# Patient Record
Sex: Female | Born: 1990 | Race: Black or African American | Hispanic: No | Marital: Single | Smoking: Current some day smoker
Health system: Southern US, Community
[De-identification: ages and names within clinical notes are randomized; demographics above are authoritative.]

## PROBLEM LIST (undated history)

## (undated) DIAGNOSIS — E559 Vitamin D deficiency, unspecified: Secondary | ICD-10-CM

## (undated) DIAGNOSIS — N879 Dysplasia of cervix uteri, unspecified: Secondary | ICD-10-CM

## (undated) DIAGNOSIS — E669 Obesity, unspecified: Secondary | ICD-10-CM

## (undated) DIAGNOSIS — M2550 Pain in unspecified joint: Secondary | ICD-10-CM

## (undated) DIAGNOSIS — R87610 Atypical squamous cells of undetermined significance on cytologic smear of cervix (ASC-US): Secondary | ICD-10-CM

## (undated) DIAGNOSIS — N939 Abnormal uterine and vaginal bleeding, unspecified: Secondary | ICD-10-CM

## (undated) HISTORY — PX: PELVIC FRACTURE SURGERY: SHX119

## (undated) HISTORY — DX: Vitamin D deficiency, unspecified: E55.9

## (undated) HISTORY — DX: Obesity, unspecified: E66.9

## (undated) HISTORY — DX: Dysplasia of cervix uteri, unspecified: N87.9

## (undated) HISTORY — PX: LEG SURGERY: SHX1003

## (undated) HISTORY — DX: Atypical squamous cells of undetermined significance on cytologic smear of cervix (ASC-US): R87.610

## (undated) HISTORY — DX: Pain in unspecified joint: M25.50

## (undated) HISTORY — DX: Abnormal uterine and vaginal bleeding, unspecified: N93.9

---
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
## 2009-01-04 ENCOUNTER — Other Ambulatory Visit: Admission: RE | Admit: 2009-01-04 | Discharge: 2009-01-04 | Payer: Self-pay | Admitting: Family Medicine

## 2010-01-14 ENCOUNTER — Emergency Department (HOSPITAL_COMMUNITY): Admission: EM | Admit: 2010-01-14 | Discharge: 2010-01-14 | Payer: Self-pay | Admitting: Emergency Medicine

## 2010-04-10 ENCOUNTER — Other Ambulatory Visit
Admission: RE | Admit: 2010-04-10 | Discharge: 2010-04-10 | Payer: Self-pay | Source: Home / Self Care | Admitting: Family Medicine

## 2010-04-29 HISTORY — PX: FEMUR FRACTURE SURGERY: SHX633

## 2010-07-12 LAB — POCT PREGNANCY, URINE: Preg Test, Ur: NEGATIVE

## 2015-10-15 ENCOUNTER — Emergency Department (HOSPITAL_COMMUNITY): Payer: Self-pay

## 2015-10-15 ENCOUNTER — Emergency Department (HOSPITAL_COMMUNITY)
Admission: EM | Admit: 2015-10-15 | Discharge: 2015-10-15 | Disposition: A | Discharge status: Home / Self Care | Payer: Self-pay | Attending: Emergency Medicine | Admitting: Emergency Medicine

## 2015-10-15 ENCOUNTER — Encounter (HOSPITAL_COMMUNITY): Payer: Self-pay

## 2015-10-15 DIAGNOSIS — Y929 Unspecified place or not applicable: Secondary | ICD-10-CM | POA: Insufficient documentation

## 2015-10-15 DIAGNOSIS — S6721XA Crushing injury of right hand, initial encounter: Secondary | ICD-10-CM | POA: Insufficient documentation

## 2015-10-15 DIAGNOSIS — W231XXA Caught, crushed, jammed, or pinched between stationary objects, initial encounter: Secondary | ICD-10-CM | POA: Insufficient documentation

## 2015-10-15 DIAGNOSIS — F17200 Nicotine dependence, unspecified, uncomplicated: Secondary | ICD-10-CM | POA: Insufficient documentation

## 2015-10-15 DIAGNOSIS — Y999 Unspecified external cause status: Secondary | ICD-10-CM | POA: Insufficient documentation

## 2015-10-15 DIAGNOSIS — Y9389 Activity, other specified: Secondary | ICD-10-CM | POA: Insufficient documentation

## 2015-10-15 MED ORDER — IBUPROFEN 800 MG PO TABS: 800.0000 mg | ORAL_TABLET | Freq: Three times a day (TID) | ORAL | Status: DC

## 2015-10-15 MED ORDER — IBUPROFEN 800 MG PO TABS
800.0000 mg | ORAL_TABLET | Freq: Once | ORAL | Status: AC
  Administered 2015-10-15: 800 mg via ORAL
  Filled 2015-10-15: qty 1

## 2015-10-15 NOTE — Discharge Instructions (Signed)
RICE for Routine Care of Injuries Theroutine careofmanyinjuriesincludes rest, ice, compression, and elevation (RICE therapy). RICE therapy is often recommended for injuries to soft tissues, such as a muscle strain, ligament injuries, bruises, and overuse injuries. It can also be used for some bony injuries. Using RICE therapy can help to relieve pain, lessen swelling, and enable your body to heal. Rest Rest is required to allow your body to heal. This usually involves reducing your normal activities and avoiding use of the injured part of your body. Generally, you can return to your normal activities when you are comfortable and have been given permission by your health care provider. Ice Icing your injury helps to keep the swelling down, and it lessens pain. Do not apply ice directly to your skin.  Put ice in a plastic bag.  Place a towel between your skin and the bag.  Leave the ice on for 20 minutes, 2-3 times a day. Do this for as long as you are directed by your health care provider. Compression Compression means putting pressure on the injured area. Compression helps to keep swelling down, gives support, and helps with discomfort. Compression may be done with an elastic bandage. If an elastic bandage has been applied, follow these general tips:  Remove and reapply the bandage every 3-4 hours or as directed by your health care provider.  Make sure the bandage is not wrapped too tightly, because this can cut off circulation. If part of your body beyond the bandage becomes blue, numb, cold, swollen, or more painful, your bandage is most likely too tight. If this occurs, remove your bandage and reapply it more loosely.  See your health care provider if the bandage seems to be making your problems worse rather than better. Elevation Elevation means keeping the injured area raised. This helps to lessen swelling and decrease pain. If possible, your injured area should be elevated at or  above the level of your heart or the center of your chest. WHEN SHOULD I SEEK MEDICAL CARE? You should seek medical care if:  Your pain and swelling continue.  Your symptoms are getting worse rather than improving. These symptoms may indicate that further evaluation or further X-rays are needed. Sometimes, X-rays may not show a small broken bone (fracture) until a number of days later. Make a follow-up appointment with your health care provider. WHEN SHOULD I SEEK IMMEDIATE MEDICAL CARE? You should seek immediate medical care if:  You have sudden severe pain at or below the area of your injury.  You have redness or increased swelling around your injury.  You have tingling or numbness at or below the area of your injury that does not improve after you remove the elastic bandage.   This information is not intended to replace advice given to you by your health care provider. Make sure you discuss any questions you have with your health care provider.   Document Released: 07/28/2000 Document Revised: 01/04/2015 Document Reviewed: 03/23/2014 Elsevier Interactive Patient Education 2016 Else[employer] Crush Injury, Fingers or Toes A crush injury to the fingers or toes means the tissues have been damaged by being squeezed (compressed). There will be bleeding into the tissues and swelling. Often, blood will collect under the skin. When this happens, the skin on the finger often dies and may slough off (shed) 1 week to 10 days later. Usually, new skin is growing underneath. If the injury has been too severe and the tissue does not survive, the damaged tissue may begin to  turn black over several days.  Wounds which occur because of the crushing may be stitched (sutured) shut. However, crush injuries are more likely to become infected than other injuries.These wounds may not be closed as tightly as other types of cuts to prevent infection. Nails involved are often lost. These usually grow back over  several weeks.  DIAGNOSIS X-rays may be taken to see if there is any injury to the bones. TREATMENT Broken bones (fractures) may be treated with splinting, depending on the fracture. Often, no treatment is required for fractures of the last bone in the fingers or toes. HOME CARE INSTRUCTIONS   The crushed part should be raised (elevated) above the heart or center of the chest as much as possible for the first several days or as directed. This helps with pain and lessens swelling. Less swelling increases the chances that the crushed part will survive.  Put ice on the injured area.  Put ice in a plastic bag.  Place a towel between your skin and the bag.  Leave the ice on for 15-20 minutes, 03-04 times a day for the first 2 days.  Only take over-the-counter or prescription medicines for pain, discomfort, or fever as directed by your caregiver.  Use your injured part only as directed.  Change your bandages (dressings) as directed.  Keep all follow-up appointments as directed by your caregiver. Not keeping your appointment could result in a chronic or permanent injury, pain, and disability. If there is any problem keeping the appointment, you must call to reschedule. SEEK IMMEDIATE MEDICAL CARE IF:   There is redness, swelling, or increasing pain in the wound area.  Pus is coming from the wound.  You have a fever.  You notice a bad smell coming from the wound or dressing.  The edges of the wound do not stay together after the sutures have been removed.  You are unable to move the injured finger or toe. MAKE SURE YOU:   Understand these instructions.  Will watch your condition.  Will get help right away if you are not doing well or get worse.   This information is not intended to replace advice given to you by your health care provider. Make sure you discuss any questions you have with your health care provider.   Document Released: 04/15/2005 Document Revised: 07/08/2011  Document Reviewed: 08/31/2010 Elsevier Interactive Patient Education [employer] Elsevier Inc[employer]

## 2015-10-15 NOTE — ED Provider Notes (Signed)
CSN: [id number]     Arrival date & time 10/15/15  1259 History  By signing my name below, I, Linna DarnerRussell Turner, attest that this documentation has been prepared under the direction and in the presence of Javon Hupfer, PA-C. Electronically Signed: Linna Darnerussell Turner, Scribe. 10/15/2015. 2:42 PM.   Chief Complaint  Patient presents with  . Hand Injury   The history is provided by the patient. No language interpreter was used.    HPI Comments: Stacey Kerr is a 25 y.o. female who presents to the Emergency Department complaining of sudden onset, constant, dorsal right hand pain and swelling beginning a couple of hours ago s/p crush injury. Pt states that she was changing a tire with a car jack and her right hand became caught between the tire and the wheel well. She immediately removed her hand and denies the hand being entrapped. She states that her right hand swelled significantly immediately after the incident. The pain is over the dorsal surface of the hand and is throbbing. The pain does not radiate into the fingers or wrist. Pt reports that she is able to move her right fingers and wrist but cannot make a fist with her right hand. She has not taken any medications PTA. She denies numbness, abrasions, lacerations, or any other associated symptoms.  History reviewed. No pertinent past medical history. Past Surgical History  Procedure Laterality Date  . Leg surgery    . Pelvic fracture surgery     No family history on file. Social History  Substance Use Topics  . Smoking status: Current Some Day Smoker  . Smokeless tobacco: None  . Alcohol Use: Yes     Comment: socially    OB History    No data available     Review of Systems  Musculoskeletal: Positive for joint swelling (right hand) and arthralgias (right hand).  Neurological: Negative for numbness.  All other systems reviewed and are negative.  Allergies  Review of patient's allergies indicates no known allergies.  Home  Medications   Prior to Admission medications   Medication Sig Start Date End Date Taking? Authorizing Provider  ibuprofen (ADVIL,MOTRIN) 800 MG tablet Take 1 tablet (800 mg total) by mouth 3 (three) times daily. 10/15/15   Cordale Manera, PA-C   BP 129/87 mmHg  Pulse 74  Temp(Src) 98.2 F (36.8 C) (Oral)  Resp 19  SpO2 98%  LMP 09/30/2015 Physical Exam  Constitutional: She appears well-developed and well-nourished. No distress.  HENT:  Head: Normocephalic and atraumatic.  Right Ear: External ear normal.  Left Ear: External ear normal.  Eyes: Conjunctivae are normal. Right eye exhibits no discharge. Left eye exhibits no discharge. No scleral icterus.  Neck: Normal range of motion.  Cardiovascular: Normal rate and intact distal pulses.   Cap refill < 2 seconds  Pulmonary/Chest: Effort normal.  Musculoskeletal:       Right hand: She exhibits decreased range of motion (fist), tenderness and swelling. She exhibits normal capillary refill, no deformity and no laceration. Normal sensation noted. Normal strength noted.  Moderate swelling noted to dorsal surface of right hand. No swelling of wrist or digits. Tenderness over dorsal surface of right hand. No tenderness of wrist, palmar surface or digits. Hand compartment is soft. FROM of digits and wrist. Pt unable to make tight fist.   Neurological: She is alert. Coordination normal.  5/5 strength at right wrist and finger abduction and adduction. Pt unable to make tight fist secondary to soft tissue swelling. Sensation to light touch  intact over hands.   Skin: Skin is warm and dry.  Psychiatric: She has a normal mood and affect. Her behavior is normal.  Nursing note and vitals reviewed.   ED Course  Procedures (including critical care time)  DIAGNOSTIC STUDIES: Oxygen Saturation is 98% on RA, normal by my interpretation.    COORDINATION OF CARE: 2:42 PM Discussed treatment plan with pt at bedside and pt agreed to plan.  Labs  Review Labs Reviewed - No data to display  Imaging Review Dg Hand Complete Right  10/15/2015  CLINICAL DATA:  Right hand pain and swelling after injury with car [place] today. Initial encounter. EXAM: RIGHT HAND - COMPLETE 3+ VIEW COMPARISON:  None. FINDINGS: There is no evidence of fracture or dislocation. There is no evidence of arthropathy or other focal bone abnormality. Soft tissues are unremarkable. IMPRESSION: Normal right hand. Electronically Signed   By: Lupita Raider, M.D.   On: 10/15/2015 13:51   I have personally reviewed and evaluated these images and lab results as part of my medical decision-making.   EKG Interpretation None      MDM   Final diagnoses:  Hand crush injury, right, initial encounter   Patient presenting with right hand pain after crush injury. Right hand is neurovascularly intact with FROM. Swelling and pain noted to dorsal surface. Compartments are soft. Patient X-Ray negative for obvious fracture or dislocation. Pain managed in ED with ibuprofen. Discussed RICE therapy and use of OTC pain relievers. Pt advised to follow up with PCP if pain and swelling persist. Return precautions discussed at bedside and given in discharge paperwork. Pt is stable for discharge.  I personally performed the services described in this documentation, which was scribed in my presence. The recorded information has been reviewed and is accurate.   Alveta Heimlich, PA-C 10/15/15 1505  Benjiman Core, MD 10/15/15 [phone]

## 2015-10-15 NOTE — ED Notes (Signed)
Pt presents with c/o right hand injury. Pt was helping someone change a tire and got her hand caught between the tire and the car while using a car jack. Pt has significant swelling to that right hand. Pt is able to move her fingers but is unable to make a fist. Pt reports pain in her wrist up to her elbow.

## 2018-02-01 IMAGING — CR DG HAND COMPLETE 3+V*R*
3 series · 3 of 3 positions shown · non-contrast
Comparison: None.

CLINICAL DATA: Right hand pain and swelling after injury with car
Jack today. Initial encounter.

EXAM:
RIGHT HAND - COMPLETE 3+ VIEW

[x hand pa right]
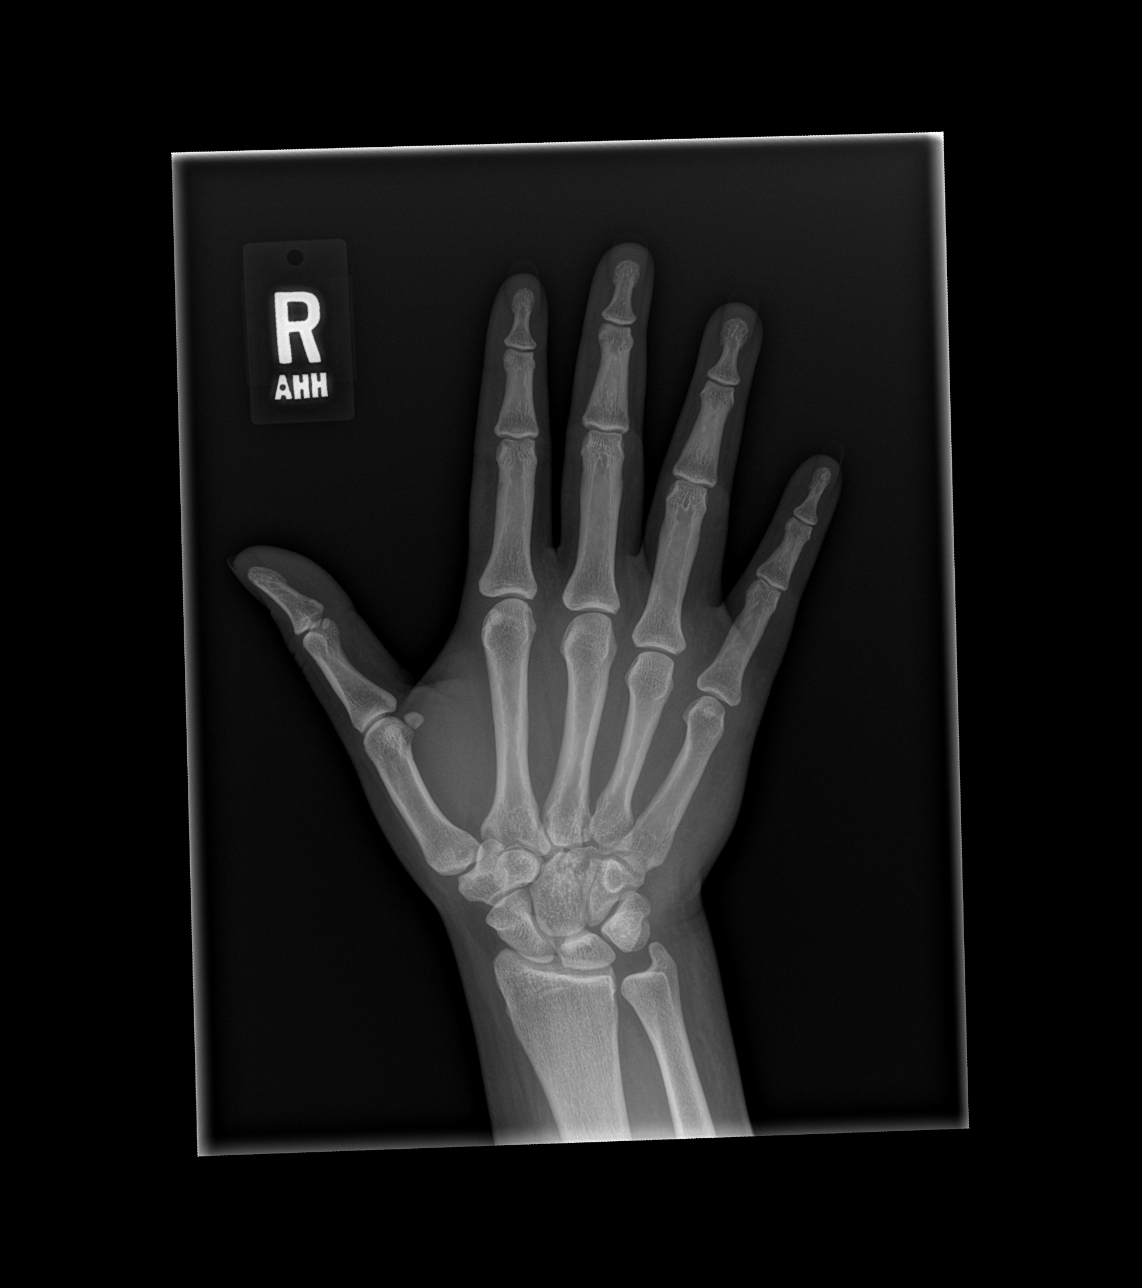

[x hand obl right]
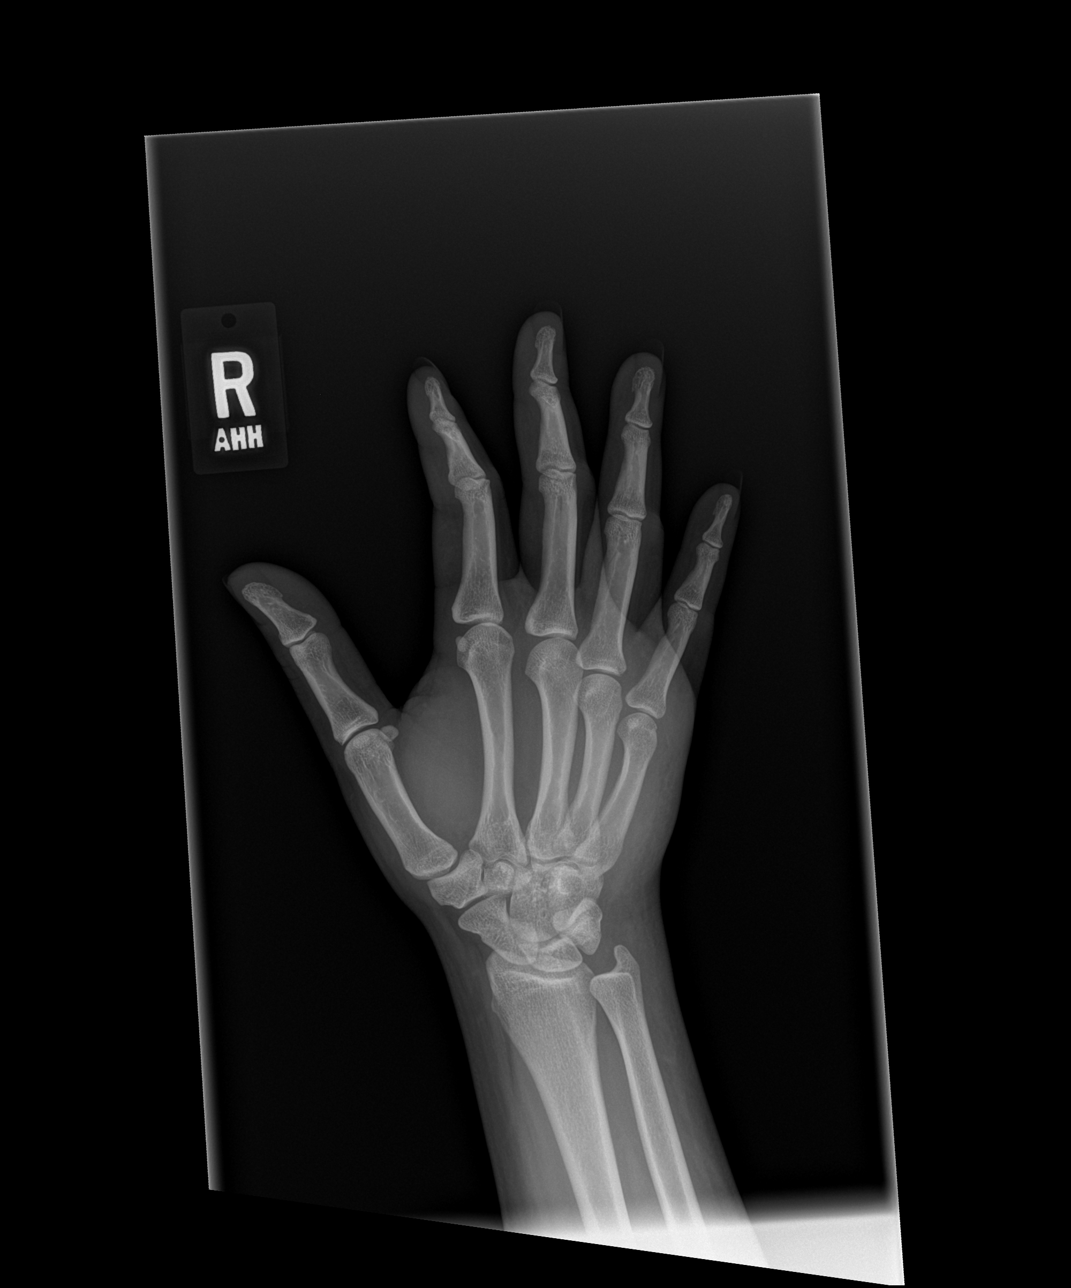

[x hand lat right]
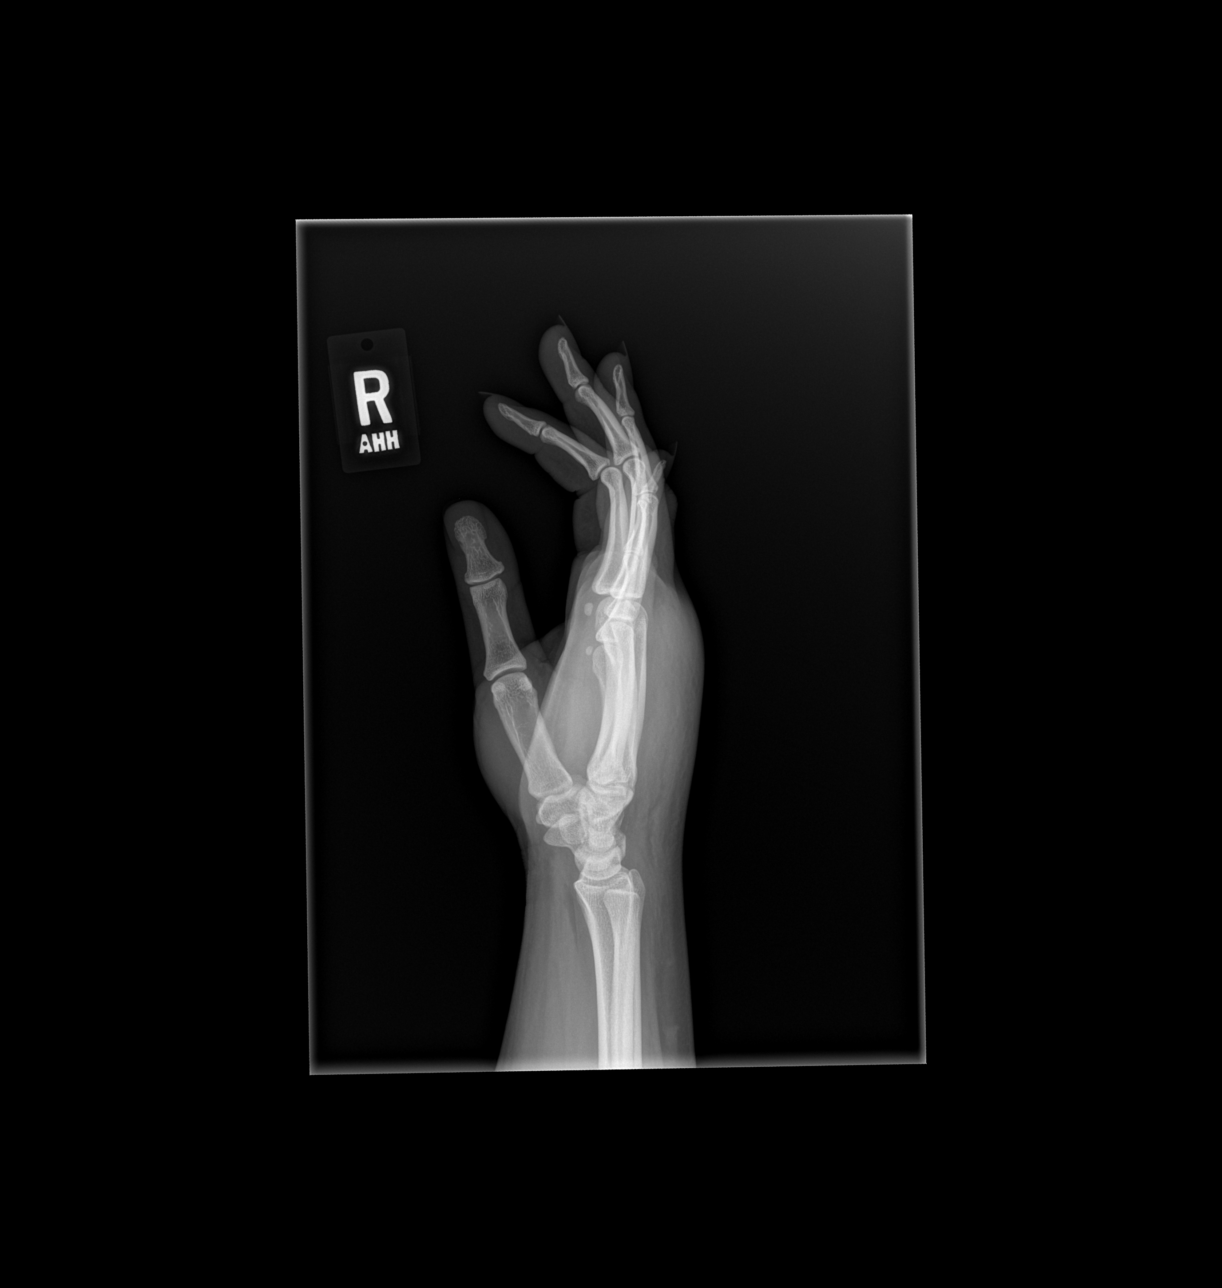

[3 of 3 positions shown; findings below may reference images not displayed]

FINDINGS: There is no evidence of fracture or dislocation. There is no
evidence of arthropathy or other focal bone abnormality. Soft
tissues are unremarkable.
IMPRESSION: Normal right hand.

## 2022-08-14 ENCOUNTER — Ambulatory Visit: Payer: Medicaid Other | Admitting: Nurse Practitioner

## 2022-08-14 ENCOUNTER — Encounter: Payer: Self-pay | Admitting: Nurse Practitioner

## 2022-08-14 VITALS — BP 103/55 | HR 76 | Temp 97.4°F | Ht 60.0 in | Wt 197.6 lb

## 2022-08-14 DIAGNOSIS — E669 Obesity, unspecified: Secondary | ICD-10-CM | POA: Diagnosis not present

## 2022-08-14 DIAGNOSIS — Z0000 Encounter for general adult medical examination without abnormal findings: Secondary | ICD-10-CM | POA: Diagnosis not present

## 2022-08-14 DIAGNOSIS — H6121 Impacted cerumen, right ear: Secondary | ICD-10-CM

## 2022-08-14 DIAGNOSIS — Z1321 Encounter for screening for nutritional disorder: Secondary | ICD-10-CM

## 2022-08-14 DIAGNOSIS — Z13228 Encounter for screening for other metabolic disorders: Secondary | ICD-10-CM | POA: Diagnosis not present

## 2022-08-14 DIAGNOSIS — Z1329 Encounter for screening for other suspected endocrine disorder: Secondary | ICD-10-CM

## 2022-08-14 DIAGNOSIS — Z130 Encounter for screening for diseases of the blood and blood-forming organs and certain disorders involving the immune mechanism: Secondary | ICD-10-CM | POA: Diagnosis not present

## 2022-08-14 DIAGNOSIS — Z720 Tobacco use: Secondary | ICD-10-CM

## 2022-08-14 DIAGNOSIS — N879 Dysplasia of cervix uteri, unspecified: Secondary | ICD-10-CM | POA: Insufficient documentation

## 2022-08-14 DIAGNOSIS — R87810 Cervical high risk human papillomavirus (HPV) DNA test positive: Secondary | ICD-10-CM

## 2022-08-14 DIAGNOSIS — R87610 Atypical squamous cells of undetermined significance on cytologic smear of cervix (ASC-US): Secondary | ICD-10-CM

## 2022-08-14 DIAGNOSIS — Z6838 Body mass index (BMI) 38.0-38.9, adult: Secondary | ICD-10-CM

## 2022-08-14 HISTORY — DX: Tobacco use: Z72.0

## 2022-08-14 HISTORY — DX: Obesity, unspecified: E66.9

## 2022-08-14 MED ORDER — DEBROX 6.5 % OT SOLN: 5.0000 [drp] | Freq: Two times a day (BID) | OTIC | 0 refills | Status: DC

## 2022-08-14 NOTE — Assessment & Plan Note (Signed)
Need to avoid smoking cigarettes due to risk of lung cancer, COPD discussed.

## 2022-08-14 NOTE — Assessment & Plan Note (Addendum)
Annual exam as documented.  Counseling done include healthy lifestyle involving committing to 150 minutes of exercise per week, heart healthy diet, and attaining healthy weight. The importance of adequate sleep also discussed.  Regular use of seat belt and home safety were also discussed . Changes in health habits are decided on by patient with goals and time frames set for achieving them. Immunization and cancer screening  needs are specifically addressed at this visit.    Fasting routine labs ordered today Up-to-date with Tdap vaccine Follow-up in 3 months for Pap smear Monthly SBE encouraged

## 2022-08-14 NOTE — Assessment & Plan Note (Signed)
Had a colposcopy last year Will be due for repeat Pap smear in June

## 2022-08-14 NOTE — Patient Instructions (Signed)
.   Screening for endocrine, nutritional, metabolic and immunity disorder  - CBC with Differential - CMP14+EGFR - TSH - Vitamin D, 25-hydroxy - Hemoglobin A1c - Lipid Panel  Annual physical exam    . Class 2 obesity without serious comorbidity with body mass index (BMI) of 38.0 to 38.9 in adult, unspecified obesity type   . Impacted cerumen of right ear  - carbamide peroxide (DEBROX) 6.5 % OTIC solution; Place 5 drops into the right ear 2 (two) times daily.  Dispense: 15 mL; Refill: 0    It is important that you exercise regularly at least 30 minutes 5 times a week as tolerated  Think about what you will eat, plan ahead. Choose " clean, green, fresh or frozen" over canned, processed or packaged foods which are more sugary, salty and fatty. 70 to 75% of food eaten should be vegetables and fruit. Three meals at set times with snacks allowed between meals, but they must be fruit or vegetables. Aim to eat over a 12 hour period , example 7 am to 7 pm, and STOP after  your last meal of the day. Drink water,generally about 64 ounces per day, no other drink is as healthy. Fruit juice is best enjoyed in a healthy way, by EATING the fruit.  Thanks for choosing [facility] we consider it a privelige to serve you.

## 2022-08-14 NOTE — Progress Notes (Signed)
Complete physical exam  Patient: Stacey Kerr   DOB: [birth year]   32 y.o. Female  MRN: [id number]  Subjective:    Chief Complaint  Patient presents with   Establish Care    Stacey Kerr is a 32 y.o. female  has a past medical history of Abnormal uterine bleeding, ASCUS with positive high risk HPV cervical, and Cervical dysplasia. who presents today for a complete physical exam. She reports consuming a general diet. Plans on starting excercises soon. The patient does not participate in regular exercise at present. She generally feels well. She reports sleeping well. She does not have additional problems to discuss today.   Previous PCP in [place], not able to remember their name.    ASCUS with positive high risk HPV.  Had colposcopy done last year with a plan to repeat Pap smear a year after.  She really denies any abnormal bleeding, vaginitis, has Nexplanon implant in place      Most recent fall risk assessment:    08/14/2022    8:21 AM  Fall Risk   Falls in the past year? 0  Number falls in past yr: 0  Injury with Fall? 0  Risk for fall due to : No Fall Risks  Follow up Falls evaluation completed     Most recent depression screenings:    08/14/2022    8:21 AM  PHQ 2/9 Scores  PHQ - 2 Score 0  PHQ- 9 Score 2        Patient Care Team: Donell Beers, FNP as PCP - General (Nurse Practitioner)   Outpatient Medications Prior to Visit  Medication Sig   ibuprofen (ADVIL,MOTRIN) 800 MG tablet Take 1 tablet (800 mg total) by mouth 3 (three) times daily. (Patient not taking: Reported on 08/14/2022)   No facility-administered medications prior to visit.    Review of Systems  Constitutional: Negative.   HENT: Negative.    Eyes: Negative.   Respiratory: Negative.    Cardiovascular: Negative.   Gastrointestinal: Negative.   Genitourinary: Negative.   Musculoskeletal: Negative.   Skin: Negative.   Neurological: Negative.   Endo/Heme/Allergies:  Negative.   Psychiatric/Behavioral: Negative.            Objective:     BP (!) 103/55   Pulse 76   Temp (!) 97.4 F (36.3 C)   Ht 5' (1.524 m)   Wt 197 lb 9.6 oz (89.6 kg)   LMP 07/26/2022 (Approximate)   SpO2 100%   BMI 38.59 kg/m    Physical Exam Constitutional:      General: She is not in acute distress.    Appearance: Normal appearance. She is obese. She is not ill-appearing, toxic-appearing or diaphoretic.  HENT:     Right Ear: Ear canal and external ear normal. There is impacted cerumen.     Left Ear: Tympanic membrane, ear canal and external ear normal. There is no impacted cerumen.     Mouth/Throat:     Mouth: Mucous membranes are moist.     Pharynx: Oropharynx is clear. No oropharyngeal exudate or posterior oropharyngeal erythema.  Eyes:     General: No scleral icterus.       Right eye: No discharge.        Left eye: No discharge.     Extraocular Movements: Extraocular movements intact.     Conjunctiva/sclera: Conjunctivae normal.  Neck:     Vascular: No carotid bruit.  Cardiovascular:     Rate and Rhythm: Normal rate and regular  rhythm.     Pulses: Normal pulses.     Heart sounds: Normal heart sounds. No murmur heard.    No friction rub. No gallop.  Pulmonary:     Effort: Pulmonary effort is normal. No respiratory distress.     Breath sounds: Normal breath sounds. No stridor. No wheezing, rhonchi or rales.  Chest:     Chest wall: No tenderness.  Abdominal:     General: There is no distension.     Palpations: Abdomen is soft. There is no mass.     Tenderness: There is no abdominal tenderness. There is no right CVA tenderness, left CVA tenderness, guarding or rebound.     Hernia: No hernia is present.  Musculoskeletal:        General: No swelling, tenderness, deformity or signs of injury.     Cervical back: Normal range of motion and neck supple. No rigidity or tenderness.     Right lower leg: No edema.     Left lower leg: No edema.   Lymphadenopathy:     Cervical: No cervical adenopathy.  Skin:    General: Skin is warm and dry.     Capillary Refill: Capillary refill takes less than 2 seconds.     Coloration: Skin is not jaundiced or pale.     Findings: No bruising, erythema, lesion or rash.  Neurological:     Mental Status: She is alert and oriented to person, place, and time.     Cranial Nerves: No cranial nerve deficit.     Sensory: No sensory deficit.     Motor: No weakness.     Coordination: Coordination normal.     Gait: Gait normal.     Deep Tendon Reflexes: Reflexes normal.  Psychiatric:        Mood and Affect: Mood normal.        Behavior: Behavior normal.        Thought Content: Thought content normal.        Judgment: Judgment normal.      No results found for any visits on 08/14/22.     Assessment & Plan:    Routine Health Maintenance and Physical Exam   There is no immunization history on file for this patient.  Health Maintenance  Topic Date Due   COVID-19 Vaccine (1) Never done   DTaP/Tdap/Td (1 - Tdap) Never done   INFLUENZA VACCINE  11/28/2022   PAP SMEAR-Modifier  08/14/2024   Hepatitis C Screening  Completed   HIV Screening  Completed   HPV VACCINES  Aged Out    Discussed health benefits of physical activity, and encouraged her to engage in regular exercise appropriate for her age and condition.  Problem List Items Addressed This Visit       Other   Obesity    Wt Readings from Last 3 Encounters:  08/14/22 197 lb 9.6 oz (89.6 kg)   Body mass index is 38.59 kg/m.  Need to increase intake of whole foods consisting mainly vegetables and protein less carbohydrate engaging in regular moderate to vigorous exercises at least 150 minutes weekly, importance of portion control, drinking at least 64 ounces of water daily discussed She plans on losing 5 to 10 pounds before next visit      ASCUS with positive high risk HPV cervical    Had a colposcopy last year Will be due for  repeat Pap smear in June      Annual physical exam - Primary    Annual exam  as documented.  Counseling done include healthy lifestyle involving committing to 150 minutes of exercise per week, heart healthy diet, and attaining healthy weight. The importance of adequate sleep also discussed.  Regular use of seat belt and home safety were also discussed . Changes in health habits are decided on by patient with goals and time frames set for achieving them. Immunization and cancer screening  needs are specifically addressed at this visit.    Fasting routine labs ordered today Up-to-date with Tdap vaccine Follow-up in 3 months for Pap smear Monthly SBE encouraged      Occasional tobacco smoker    Need to avoid smoking cigarettes due to risk of lung cancer, COPD discussed.      Other Visit Diagnoses     Screening for endocrine, nutritional, metabolic and immunity disorder       Relevant Orders   CBC with Differential   CMP14+EGFR   TSH   Vitamin D, 25-hydroxy   Hemoglobin A1c   Lipid Panel   Impacted cerumen of right ear       Relevant Medications   carbamide peroxide (DEBROX) 6.5 % OTIC solution      Return in about 3 months (around 11/13/2022) for PAP.     Donell Beers, FNP

## 2022-08-14 NOTE — Assessment & Plan Note (Signed)
Wt Readings from Last 3 Encounters:  08/14/22 197 lb 9.6 oz (89.6 kg)   Body mass index is 38.59 kg/m.  Need to increase intake of whole foods consisting mainly vegetables and protein less carbohydrate engaging in regular moderate to vigorous exercises at least 150 minutes weekly, importance of portion control, drinking at least 64 ounces of water daily discussed She plans on losing 5 to 10 pounds before next visit

## 2022-08-15 LAB — CBC WITH DIFFERENTIAL/PLATELET
Basophils Absolute: 0 10*3/uL (ref 0.0–0.2)
Basos: 1 %
EOS (ABSOLUTE): 0.1 10*3/uL (ref 0.0–0.4)
Eos: 1 %
Hematocrit: 39.6 % (ref 34.0–46.6)
Hemoglobin: 12.5 g/dL (ref 11.1–15.9)
Immature Grans (Abs): 0 10*3/uL (ref 0.0–0.1)
Immature Granulocytes: 1 %
Lymphocytes Absolute: 2.2 10*3/uL (ref 0.7–3.1)
Lymphs: 25 %
MCH: 27.5 pg (ref 26.6–33.0)
MCHC: 31.6 g/dL (ref 31.5–35.7)
MCV: 87 fL (ref 79–97)
Monocytes Absolute: 0.6 10*3/uL (ref 0.1–0.9)
Monocytes: 7 %
Neutrophils Absolute: 5.9 10*3/uL (ref 1.4–7.0)
Neutrophils: 65 %
Platelets: 319 10*3/uL (ref 150–450)
RBC: 4.54 x10E6/uL (ref 3.77–5.28)
RDW: 13.1 % (ref 11.7–15.4)
WBC: 8.8 10*3/uL (ref 3.4–10.8)

## 2022-08-15 LAB — CMP14+EGFR
ALT: 24 IU/L (ref 0–32)
AST: 16 IU/L (ref 0–40)
Albumin/Globulin Ratio: 2 (ref 1.2–2.2)
Albumin: 4.5 g/dL (ref 3.9–4.9)
Alkaline Phosphatase: 72 IU/L (ref 44–121)
BUN/Creatinine Ratio: 11 (ref 9–23)
BUN: 9 mg/dL (ref 6–20)
Bilirubin Total: 0.2 mg/dL (ref 0.0–1.2)
CO2: 22 mmol/L (ref 20–29)
Calcium: 9.2 mg/dL (ref 8.7–10.2)
Chloride: 104 mmol/L (ref 96–106)
Creatinine, Ser: 0.83 mg/dL (ref 0.57–1.00)
Globulin, Total: 2.3 g/dL (ref 1.5–4.5)
Glucose: 86 mg/dL (ref 70–99)
Potassium: 4.4 mmol/L (ref 3.5–5.2)
Sodium: 140 mmol/L (ref 134–144)
Total Protein: 6.8 g/dL (ref 6.0–8.5)
eGFR: 95 mL/min/{1.73_m2} (ref >59)

## 2022-08-15 LAB — HEMOGLOBIN A1C
Est. average glucose Bld gHb Est-mCnc: 117 mg/dL
Hgb A1c MFr Bld: 5.7 % — ABNORMAL HIGH (ref 4.8–5.6)

## 2022-08-15 LAB — LIPID PANEL
Chol/HDL Ratio: 2.7 ratio (ref 0.0–4.4)
Cholesterol, Total: 191 mg/dL (ref 100–199)
HDL: 70 mg/dL (ref >39)
LDL Chol Calc (NIH): 106 mg/dL — ABNORMAL HIGH (ref 0–99)
Triglycerides: 84 mg/dL (ref 0–149)
VLDL Cholesterol Cal: 15 mg/dL (ref 5–40)

## 2022-08-15 LAB — TSH: TSH: 1.79 u[IU]/mL (ref 0.450–4.500)

## 2022-08-15 LAB — VITAMIN D 25 HYDROXY (VIT D DEFICIENCY, FRACTURES): Vit D, 25-Hydroxy: 6 ng/mL — ABNORMAL LOW (ref 30.0–100.0)

## 2022-08-19 ENCOUNTER — Other Ambulatory Visit: Payer: Self-pay | Admitting: Nurse Practitioner

## 2022-08-19 DIAGNOSIS — E559 Vitamin D deficiency, unspecified: Secondary | ICD-10-CM

## 2022-08-19 MED ORDER — VITAMIN D (ERGOCALCIFEROL) 1.25 MG (50000 UNIT) PO CAPS: 50000.0000 [IU] | ORAL_CAPSULE | ORAL | 0 refills | Status: DC

## 2022-08-19 NOTE — Progress Notes (Signed)
Vitamin D deficiency.  Please start taking vitamin D 50,000 units weekly Foods rich  in vitamin D include Cod liver oil,Salmon,Swordfish,Tuna fish,Dairy and plant milks fortified with vitamin D,Sardines,Beef liver.  Get early morning sunshine.  Prediabetes avoid sugar sweets soda  Eat a healthy diet, including lots of fruits and vegetables. Avoid foods with a lot of saturated and trans fats, such as red meat, butter, fried foods and cheese . Maintain a healthy weight.   Nurse please schedule an appointment for her to see me in 3 months in 3 months to recheck her vitamin D levels.

## 2022-10-24 ENCOUNTER — Ambulatory Visit: Payer: Commercial Managed Care - PPO | Admitting: Podiatry

## 2022-10-24 DIAGNOSIS — M21961 Unspecified acquired deformity of right lower leg: Secondary | ICD-10-CM

## 2022-10-24 DIAGNOSIS — M21962 Unspecified acquired deformity of left lower leg: Secondary | ICD-10-CM

## 2022-10-24 DIAGNOSIS — M7751 Other enthesopathy of right foot: Secondary | ICD-10-CM | POA: Diagnosis not present
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
## 2022-10-28 ENCOUNTER — Ambulatory Visit: Payer: Commercial Managed Care - PPO | Admitting: Nurse Practitioner

## 2022-10-28 ENCOUNTER — Other Ambulatory Visit (HOSPITAL_COMMUNITY)
Admission: RE | Admit: 2022-10-28 | Discharge: 2022-10-28 | Disposition: A | Discharge status: Home / Self Care | Payer: Commercial Managed Care - PPO | Source: Ambulatory Visit | Attending: Nurse Practitioner | Admitting: Nurse Practitioner

## 2022-10-28 VITALS — BP 102/57 | HR 64 | Ht 60.0 in | Wt 201.4 lb

## 2022-10-28 DIAGNOSIS — E559 Vitamin D deficiency, unspecified: Secondary | ICD-10-CM | POA: Diagnosis not present

## 2022-10-28 DIAGNOSIS — Z124 Encounter for screening for malignant neoplasm of cervix: Secondary | ICD-10-CM | POA: Insufficient documentation

## 2022-10-28 DIAGNOSIS — E669 Obesity, unspecified: Secondary | ICD-10-CM | POA: Diagnosis not present

## 2022-10-28 DIAGNOSIS — R7303 Prediabetes: Secondary | ICD-10-CM | POA: Diagnosis not present

## 2022-10-28 DIAGNOSIS — Z6838 Body mass index (BMI) 38.0-38.9, adult: Secondary | ICD-10-CM

## 2022-10-28 HISTORY — DX: Prediabetes: R73.03

## 2022-10-28 NOTE — Assessment & Plan Note (Signed)
Lab Results  Component Value Date   HGBA1C 5.7 (H) 08/14/2022  Avoid sugar sweets soda

## 2022-10-28 NOTE — Assessment & Plan Note (Addendum)
Wt Readings from Last 3 Encounters:  10/28/22 201 lb 6.4 oz (91.4 kg)  08/14/22 197 lb 9.6 oz (89.6 kg)  Body mass index is 39.33 kg/m.  She has started about 4 pounds since her last visit Works out twice [employer] a lot every day.  States that her diet can be better. We discussed referral to medical weight management Referral sent today Patient counseled on low-carb modified diet Encouraged to engage in regular moderate to vigorous exercise at least 150 minutes weekly

## 2022-10-28 NOTE — Assessment & Plan Note (Signed)
Last vitamin D Lab Results  Component Value Date   VD25OH 6.0 (L) 08/14/2022  Continue vitamin D 50,000 units once weekly After completion take vitamin D 1000 units daily Will recheck labs in 3 months

## 2022-10-28 NOTE — Progress Notes (Signed)
Established Patient Office Visit  Subjective:  Patient ID: Stacey Kerr, female    DOB: 06-18-1990  Age: 32 y.o. MRN: [id number]  CC:  Chief Complaint  Patient presents with   Follow-up    HPI Stacey Kerr is a 32 y.o. female  has a past medical history of Abnormal uterine bleeding, ASCUS with positive high risk HPV cervical, Cervical dysplasia, Obesity (BMI 35.0-39.9 without comorbidity), and Vitamin D deficiency.   Patient presents for Pap smear.  She had a ASCUS with positive high risk HPV a year ago had colposcopy done with the plan to repeat Pap smear in a year.  Patient denies vaginitis, abnormal vaginal bleeding, weight loss   Obesity .she has started working out 2 times a week, states that her diet can be better.  Vitamin D deficiency .taking vitamin D 50,000 units once weekly but not consistently    Past Medical History:  Diagnosis Date   Abnormal uterine bleeding    ASCUS with positive high risk HPV cervical    Cervical dysplasia    Obesity (BMI 35.0-39.9 without comorbidity)    Vitamin D deficiency     Past Surgical History:  Procedure Laterality Date   CESAREAN SECTION     FEMUR FRACTURE SURGERY Right 2012   LEG SURGERY     PELVIC FRACTURE SURGERY      Family History  Problem Relation Age of Onset   Hypertension Mother    Hypertension Father    Colon cancer Maternal Grandfather    Breast cancer Neg Hx    Cervical cancer Neg Hx     Social History   Socioeconomic History   Marital status: Single    Spouse name: Not on file   Number of children: 1   Years of education: Not on file   Highest education level: Bachelor's degree (e.g., BA, AB, BS)  Occupational History   Not on file  Tobacco Use   Smoking status: Some Days   Smokeless tobacco: Not on file   Tobacco comments:    Socially smokes a cigarettes every 4 months.   Substance and Sexual Activity   Alcohol use: Yes    Comment: socially    Drug use: No   Sexual activity:  Yes  Other Topics Concern   Not on file  Social History Narrative   Lives with parents    Social Determinants of Health   Financial Resource Strain: Low Risk  (10/28/2022)   Overall Financial Resource Strain (CARDIA)    Difficulty of Paying Living Expenses: Not very hard  Food Insecurity: No Food Insecurity (10/28/2022)   Hunger Vital Sign    Worried About Running Out of Food in the Last Year: Never true    Ran Out of Food in the Last Year: Never true  Transportation Needs: No Transportation Needs (10/28/2022)   PRAPARE - Administrator, Civil Service (Medical): No    Lack of Transportation (Non-Medical): No  Physical Activity: Insufficiently Active (10/28/2022)   Exercise Vital Sign    Days of Exercise per Week: 3 days    Minutes of Exercise per Session: 30 min  Stress: Stress Concern Present (10/28/2022)   [name]    Feeling of Stress : To some extent  Social Connections: Moderately Integrated (10/28/2022)   Social Connection and Isolation Panel [NHANES]    Frequency of Communication with Friends and Family: More than three times a week    Frequency of Social Gatherings  with Friends and Family: Once a week    Attends Religious Services: 1 to 4 times per year    Active Member of Clubs or Organizations: Yes    Attends Banker Meetings: 1 to 4 times per year    Marital Status: Never married  Catering manager Violence: Not on file    Outpatient Medications Prior to Visit  Medication Sig Dispense Refill   ibuprofen (ADVIL,MOTRIN) 800 MG tablet Take 1 tablet (800 mg total) by mouth 3 (three) times daily. 21 tablet 0   Vitamin D, Ergocalciferol, (DRISDOL) 1.25 MG (50000 UNIT) CAPS capsule Take 1 capsule (50,000 Units total) by mouth every 7 (seven) days. 12 capsule 0   carbamide peroxide (DEBROX) 6.5 % OTIC solution Place 5 drops into the right ear 2 (two) times daily. (Patient not taking: Reported on  10/28/2022) 15 mL 0   No facility-administered medications prior to visit.    No Known Allergies  ROS Review of Systems  Constitutional:  Negative for activity change, appetite change, chills, fatigue and fever.  HENT:  Negative for congestion, dental problem, ear discharge, ear pain, hearing loss, rhinorrhea, sinus pressure, sinus pain, sneezing and sore throat.   Eyes:  Negative for pain, discharge, redness and itching.  Respiratory:  Negative for cough, chest tightness, shortness of breath and wheezing.   Cardiovascular:  Negative for chest pain, palpitations and leg swelling.  Gastrointestinal:  Negative for abdominal distention, abdominal pain, anal bleeding, blood in stool, constipation, diarrhea, nausea, rectal pain and vomiting.  Endocrine: Negative for cold intolerance, heat intolerance, polydipsia, polyphagia and polyuria.  Genitourinary:  Negative for difficulty urinating, dysuria, flank pain, frequency, hematuria, menstrual problem, pelvic pain and vaginal bleeding.  Musculoskeletal:  Negative for arthralgias, back pain, gait problem, joint swelling and myalgias.  Skin:  Negative for color change, pallor, rash and wound.  Allergic/Immunologic: Negative for environmental allergies, food allergies and immunocompromised state.  Neurological:  Negative for dizziness, tremors, facial asymmetry, weakness and headaches.  Hematological:  Negative for adenopathy. Does not bruise/bleed easily.  Psychiatric/Behavioral:  Negative for agitation, behavioral problems, confusion, decreased concentration, hallucinations, self-injury and suicidal ideas.       Objective:    Physical Exam Vitals and nursing note reviewed. Exam conducted with a chaperone present.  Constitutional:      General: She is not in acute distress.    Appearance: Normal appearance. She is obese. She is not ill-appearing, toxic-appearing or diaphoretic.  HENT:     Mouth/Throat:     Mouth: Mucous membranes are moist.      Pharynx: Oropharynx is clear. No oropharyngeal exudate or posterior oropharyngeal erythema.  Eyes:     General: No scleral icterus.       Right eye: No discharge.        Left eye: No discharge.     Extraocular Movements: Extraocular movements intact.     Conjunctiva/sclera: Conjunctivae normal.  Cardiovascular:     Rate and Rhythm: Normal rate and regular rhythm.     Pulses: Normal pulses.     Heart sounds: Normal heart sounds. No murmur heard.    No friction rub. No gallop.  Pulmonary:     Effort: Pulmonary effort is normal. No respiratory distress.     Breath sounds: Normal breath sounds. No stridor. No wheezing, rhonchi or rales.  Abdominal:     General: There is no distension.     Palpations: Abdomen is soft.     Tenderness: There is no abdominal tenderness. There is  no right CVA tenderness, left CVA tenderness or guarding.     Hernia: There is no hernia in the left inguinal area or right inguinal area.  Genitourinary:    General: Normal vulva.     Exam position: Lithotomy position.     Pubic Area: No rash or pubic lice.      Tanner stage (genital): 5.     Labia:        Right: No rash, tenderness, lesion or injury.        Left: No rash, tenderness, lesion or injury.      Urethra: No prolapse, urethral pain, urethral swelling or urethral lesion.     Vagina: No signs of injury and foreign body. No vaginal discharge, erythema, tenderness, bleeding, lesions or prolapsed vaginal walls.     Cervix: No cervical motion tenderness, discharge, friability, lesion, erythema, cervical bleeding or eversion.     Uterus: Normal. Not enlarged, not fixed, not tender and no uterine prolapse.      Adnexa:        Right: No mass, tenderness or fullness.         Left: No mass, tenderness or fullness.    Musculoskeletal:        General: No swelling, tenderness, deformity or signs of injury.     Right lower leg: No edema.     Left lower leg: No edema.  Lymphadenopathy:     Lower Body: No right  inguinal adenopathy. No left inguinal adenopathy.  Skin:    General: Skin is warm and dry.     Capillary Refill: Capillary refill takes less than 2 seconds.     Coloration: Skin is not jaundiced or pale.     Findings: No bruising, erythema or lesion.  Neurological:     Mental Status: She is alert and oriented to person, place, and time.     Motor: No weakness.     Coordination: Coordination normal.     Gait: Gait normal.  Psychiatric:        Mood and Affect: Mood normal.        Behavior: Behavior normal.        Thought Content: Thought content normal.        Judgment: Judgment normal.     BP (!) 102/57   Pulse 64   Ht 5' (1.524 m)   Wt 201 lb 6.4 oz (91.4 kg)   LMP 10/26/2022 (Approximate)   SpO2 100%   BMI 39.33 kg/m  Wt Readings from Last 3 Encounters:  10/28/22 201 lb 6.4 oz (91.4 kg)  08/14/22 197 lb 9.6 oz (89.6 kg)    Lab Results  Component Value Date   TSH 1.790 08/14/2022   Lab Results  Component Value Date   WBC 8.8 08/14/2022   HGB 12.5 08/14/2022   HCT 39.6 08/14/2022   MCV 87 08/14/2022   PLT 319 08/14/2022   Lab Results  Component Value Date   NA 140 08/14/2022   K 4.4 08/14/2022   CO2 22 08/14/2022   GLUCOSE 86 08/14/2022   BUN 9 08/14/2022   CREATININE 0.83 08/14/2022   BILITOT 0.2 08/14/2022   ALKPHOS 72 08/14/2022   AST 16 08/14/2022   ALT 24 08/14/2022   PROT 6.8 08/14/2022   ALBUMIN 4.5 08/14/2022   CALCIUM 9.2 08/14/2022   EGFR 95 08/14/2022   Lab Results  Component Value Date   CHOL 191 08/14/2022   Lab Results  Component Value Date   HDL 70 08/14/2022  Lab Results  Component Value Date   LDLCALC 106 (H) 08/14/2022   Lab Results  Component Value Date   TRIG 84 08/14/2022   Lab Results  Component Value Date   CHOLHDL 2.7 08/14/2022   Lab Results  Component Value Date   HGBA1C 5.7 (H) 08/14/2022      Assessment & Plan:   Problem List Items Addressed This Visit       Other   Obesity    Wt Readings from  Last 3 Encounters:  10/28/22 201 lb 6.4 oz (91.4 kg)  08/14/22 197 lb 9.6 oz (89.6 kg)  Body mass index is 39.33 kg/m.  She has started about 4 pounds since her last visit Works out twice [employer] a lot every day.  States that her diet can be better. We discussed referral to medical weight management Referral sent today Patient counseled on low-carb modified diet Encouraged to engage in regular moderate to vigorous exercise at least 150 minutes weekly       Relevant Orders   Amb Ref to Medical Weight Management   Screening for cervical cancer - Primary    Pap smear completed without difficulty Sample sent to the lab for test      Relevant Orders   Cytology - PAP([facility])   Vitamin D deficiency    Last vitamin D Lab Results  Component Value Date   VD25OH 6.0 (L) 08/14/2022  Continue vitamin D 50,000 units once weekly After completion take vitamin D 1000 units daily Will recheck labs in 3 months      Prediabetes    Lab Results  Component Value Date   HGBA1C 5.7 (H) 08/14/2022  Avoid sugar sweets soda        No orders of the defined types were placed in this encounter.   Follow-up: Return in about 3 months (around 01/28/2023) for VITAMIN D.    Donell Beers, FNP

## 2022-10-28 NOTE — Patient Instructions (Signed)
Please  continue vitamin D 50,000 units once weekly for 8 weeks.  After completion take vitamin D 1000 units daily.  Foods rich in vitamin D include cod liver oil,Salmon,Swordfish,Tuna fish,Orange juice fortified with vitamin D,Dairy and plant milks fortified with vitamin D,Sardines,Beef liver     It is important that you exercise regularly at least 30 minutes 5 times a week as tolerated  Think about what you will eat, plan ahead. Choose " clean, green, fresh or frozen" over canned, processed or packaged foods which are more sugary, salty and fatty. 70 to 75% of food eaten should be vegetables and fruit. Three meals at set times with snacks allowed between meals, but they must be fruit or vegetables. Aim to eat over a 12 hour period , example 7 am to 7 pm, and STOP after  your last meal of the day. Drink water,generally about 64 ounces per day, no other drink is as healthy. Fruit juice is best enjoyed in a healthy way, by EATING the fruit.  Thanks for choosing [facility] we consider it a privelige to serve you.

## 2022-10-28 NOTE — Assessment & Plan Note (Addendum)
Pap smear completed without difficulty Sample sent to the lab for test

## 2022-11-04 LAB — CYTOLOGY - PAP
Adequacy: ABSENT
Comment: NEGATIVE
Diagnosis: NEGATIVE
High risk HPV: NEGATIVE

## 2022-11-13 ENCOUNTER — Ambulatory Visit: Payer: Commercial Managed Care - PPO

## 2022-11-13 NOTE — Progress Notes (Signed)
7/17 patient was present and fit with new orthotics, orthotics provide total contact to BIL MLA's helping to better distribute body weight more evenly greater reducing plantar pressure and pain   Patient was given wear care and break in instructions and will call if any problems arise  Stacey Kerr C.ped, CFo, CFm

## 2022-12-31 DIAGNOSIS — Z0289 Encounter for other administrative examinations: Secondary | ICD-10-CM

## 2023-01-06 ENCOUNTER — Encounter (INDEPENDENT_AMBULATORY_CARE_PROVIDER_SITE_OTHER): Payer: Self-pay | Admitting: Adult Health

## 2023-01-06 ENCOUNTER — Ambulatory Visit (INDEPENDENT_AMBULATORY_CARE_PROVIDER_SITE_OTHER): Payer: Commercial Managed Care - PPO | Admitting: Adult Health

## 2023-01-06 VITALS — BP 105/69 | HR 69 | Temp 97.9°F | Ht 60.0 in | Wt 201.0 lb

## 2023-01-06 DIAGNOSIS — R7303 Prediabetes: Secondary | ICD-10-CM

## 2023-01-06 DIAGNOSIS — Z6839 Body mass index (BMI) 39.0-39.9, adult: Secondary | ICD-10-CM

## 2023-01-06 DIAGNOSIS — E669 Obesity, unspecified: Secondary | ICD-10-CM

## 2023-01-06 NOTE — Progress Notes (Signed)
Office: [phone]  /  Fax: [phone]   Initial Visit  Stacey Kerr was seen in clinic today to evaluate for obesity. She is interested in losing weight to improve overall health and reduce the risk of weight related complications. She presents today to review program treatment options, initial physical assessment, and evaluation.     She was referred by: PCP  When asked what else they would like to accomplish? She states: Adopt healthier eating patterns  Weight history: She has been lifelong athlete- weight gain after healthy pregnancy in 2022  When asked how has your weight affected you? She states: Problems with eating patterns  Some associated conditions: Prediabetes  Contributing factors: Family history, Nutritional, and Pregnancy  Weight promoting medications identified: Contraceptives or hormonal therapy  Current nutrition plan: None  Current level of physical activity: Walking and Strength training  Current or previous pharmacotherapy: Other: HcG Injections  Response to medication: Lost weight initially but was unable to sustain weight loss   Past medical history includes:   Past Medical History:  Diagnosis Date   Abnormal uterine bleeding    ASCUS with positive high risk HPV cervical    Cervical dysplasia    Obesity (BMI 35.0-39.9 without comorbidity)    Vitamin D deficiency      Objective:   BP 105/69   Pulse 69   Temp 97.9 F (36.6 C)   Ht 5' (1.524 m)   Wt 201 lb (91.2 kg)   SpO2 99%   BMI 39.26 kg/m  She was weighed on the bioimpedance scale: Body mass index is 39.26 kg/m.  Peak Weight:203 , Body Fat%:42.7, Visceral Fat Rating:10, Weight trend over the last 12 months: Increasing  General:  Alert, oriented and cooperative. Patient is in no acute distress.  Respiratory: Normal respiratory effort, no problems with respiration noted   Gait: able to ambulate independently  Mental Status: Normal mood and affect. Normal behavior. Normal  judgment and thought content.   DIAGNOSTIC DATA REVIEWED:  BMET    Component Value Date/Time   NA 140 08/14/2022 0901   K 4.4 08/14/2022 0901   CL 104 08/14/2022 0901   CO2 22 08/14/2022 0901   GLUCOSE 86 08/14/2022 0901   BUN 9 08/14/2022 0901   CREATININE 0.83 08/14/2022 0901   CALCIUM 9.2 08/14/2022 0901   Lab Results  Component Value Date   HGBA1C 5.7 (H) 08/14/2022   No results found for: "INSULIN" CBC    Component Value Date/Time   WBC 8.8 08/14/2022 0901   RBC 4.54 08/14/2022 0901   HGB 12.5 08/14/2022 0901   HCT 39.6 08/14/2022 0901   PLT 319 08/14/2022 0901   MCV 87 08/14/2022 0901   MCH 27.5 08/14/2022 0901   MCHC 31.6 08/14/2022 0901   RDW 13.1 08/14/2022 0901   Iron/TIBC/Ferritin/ %Sat No results found for: "IRON", "TIBC", "FERRITIN", "IRONPCTSAT" Lipid Panel     Component Value Date/Time   CHOL 191 08/14/2022 0901   TRIG 84 08/14/2022 0901   HDL 70 08/14/2022 0901   CHOLHDL 2.7 08/14/2022 0901   LDLCALC 106 (H) 08/14/2022 0901   Hepatic Function Panel     Component Value Date/Time   PROT 6.8 08/14/2022 0901   ALBUMIN 4.5 08/14/2022 0901   AST 16 08/14/2022 0901   ALT 24 08/14/2022 0901   ALKPHOS 72 08/14/2022 0901   BILITOT 0.2 08/14/2022 0901      Component Value Date/Time   TSH 1.790 08/14/2022 0901     Assessment and Plan:  Prediabetes  Obesity (BMI 30-39.9), Starting BMI 39.26  ESTABLISH WITH HWW   Obesity Treatment / Action Plan:  Patient will work on garnering support from family and friends to begin weight loss journey. Will work on eliminating or reducing the presence of highly palatable, calorie dense foods in the home. Will complete provided nutritional and psychosocial assessment questionnaire before the next appointment. Will be scheduled for indirect calorimetry to determine resting energy expenditure in a fasting state.  This will allow [place] to create a reduced calorie, high-protein meal plan to promote loss of fat  mass while preserving muscle mass. Counseled on the health benefits of losing 5%-15% of total body weight. Was counseled on nutritional approaches to weight loss and benefits of reducing processed foods and consuming plant-based foods and high quality protein as part of nutritional weight management. Was counseled on pharmacotherapy and role as an adjunct in weight management.   Obesity Education Performed Today:  She was weighed on the bioimpedance scale and results were discussed and documented in the synopsis.  We discussed obesity as a disease and the importance of a more detailed evaluation of all the factors contributing to the disease.  We discussed the importance of long term lifestyle changes which include nutrition, exercise and behavioral modifications as well as the importance of customizing this to her specific health and social needs.  We discussed the benefits of reaching a healthier weight to alleviate the symptoms of existing conditions and reduce the risks of the biomechanical, metabolic and psychological effects of obesity.  [name] appears to be in the action stage of change and states they are ready to start intensive lifestyle modifications and behavioral modifications.  30 minutes was spent today on this visit including the above counseling, pre-visit chart review, and post-visit documentation.  Reviewed by clinician on day of visit: allergies, medications, problem list, medical history, surgical history, family history, social history, and previous encounter notes pertinent to obesity diagnosis.  Kelli Egolf d. Jaden Batchelder, NP-C

## 2023-01-14 ENCOUNTER — Encounter (INDEPENDENT_AMBULATORY_CARE_PROVIDER_SITE_OTHER): Payer: Self-pay | Admitting: Family Medicine

## 2023-01-14 ENCOUNTER — Ambulatory Visit (INDEPENDENT_AMBULATORY_CARE_PROVIDER_SITE_OTHER): Payer: Commercial Managed Care - PPO | Admitting: Family Medicine

## 2023-01-14 VITALS — BP 104/70 | HR 70 | Temp 98.1°F | Ht 60.0 in | Wt 199.0 lb

## 2023-01-14 DIAGNOSIS — Z789 Other specified health status: Secondary | ICD-10-CM | POA: Diagnosis not present

## 2023-01-14 DIAGNOSIS — R7303 Prediabetes: Secondary | ICD-10-CM | POA: Diagnosis not present

## 2023-01-14 DIAGNOSIS — E669 Obesity, unspecified: Secondary | ICD-10-CM | POA: Diagnosis not present

## 2023-01-14 DIAGNOSIS — Z1331 Encounter for screening for depression: Secondary | ICD-10-CM | POA: Diagnosis not present

## 2023-01-14 DIAGNOSIS — R5383 Other fatigue: Secondary | ICD-10-CM

## 2023-01-14 DIAGNOSIS — Z6839 Body mass index (BMI) 39.0-39.9, adult: Secondary | ICD-10-CM

## 2023-01-14 DIAGNOSIS — F3289 Other specified depressive episodes: Secondary | ICD-10-CM

## 2023-01-14 DIAGNOSIS — R0602 Shortness of breath: Secondary | ICD-10-CM
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
## 2023-01-28 ENCOUNTER — Encounter: Payer: Self-pay | Admitting: Nurse Practitioner

## 2023-01-28 ENCOUNTER — Ambulatory Visit (INDEPENDENT_AMBULATORY_CARE_PROVIDER_SITE_OTHER): Payer: Commercial Managed Care - PPO | Admitting: Nurse Practitioner

## 2023-01-28 VITALS — BP 118/61 | HR 73 | Resp 16 | Ht 60.0 in | Wt 203.8 lb

## 2023-01-28 DIAGNOSIS — E559 Vitamin D deficiency, unspecified: Secondary | ICD-10-CM | POA: Diagnosis not present

## 2023-01-28 DIAGNOSIS — E66812 Obesity, class 2: Secondary | ICD-10-CM | POA: Diagnosis not present

## 2023-01-28 DIAGNOSIS — E782 Mixed hyperlipidemia: Secondary | ICD-10-CM

## 2023-01-28 DIAGNOSIS — Z720 Tobacco use: Secondary | ICD-10-CM | POA: Diagnosis not present

## 2023-01-28 DIAGNOSIS — E6609 Other obesity due to excess calories: Secondary | ICD-10-CM

## 2023-01-28 DIAGNOSIS — G4700 Insomnia, unspecified: Secondary | ICD-10-CM | POA: Diagnosis not present

## 2023-01-28 DIAGNOSIS — E785 Hyperlipidemia, unspecified: Secondary | ICD-10-CM | POA: Insufficient documentation

## 2023-01-28 DIAGNOSIS — Z6839 Body mass index (BMI) 39.0-39.9, adult: Secondary | ICD-10-CM | POA: Diagnosis not present

## 2023-01-28 HISTORY — DX: Insomnia, unspecified: G47.00

## 2023-01-28 MED ORDER — VITAMIN D (ERGOCALCIFEROL) 1.25 MG (50000 UNIT) PO CAPS: 50000.0000 [IU] | ORAL_CAPSULE | ORAL | 0 refills | Status: DC

## 2023-01-28 NOTE — Patient Instructions (Addendum)
1. Vitamin D deficiency  - Vitamin D, Ergocalciferol, (DRISDOL) 1.25 MG (50000 UNIT) CAPS capsule; Take 1 capsule (50,000 Units total) by mouth every 7 (seven) days.  Dispense: 8 capsule; Refill: 0  2. Insomnia, unspecified type  - Home sleep test; Future    Cod liver oil,Salmon,Swordfish,Tuna fish,Orange juice fortified with vitamin D,Dairy and plant milks fortified with vitamin D,Sardines,Beef liver    It is important that you exercise regularly at least 30 minutes 5 times a week as tolerated  Think about what you will eat, plan ahead. Choose " clean, green, fresh or frozen" over canned, processed or packaged foods which are more sugary, salty and fatty. 70 to 75% of food eaten should be vegetables and fruit. Three meals at set times with snacks allowed between meals, but they must be fruit or vegetables. Aim to eat over a 12 hour period , example 7 am to 7 pm, and STOP after  your last meal of the day. Drink water,generally about 64 ounces per day, no other drink is as healthy. Fruit juice is best enjoyed in a healthy way, by EATING the fruit.  Thanks for choosing [facility] we consider it a privelige to serve you.

## 2023-01-28 NOTE — Assessment & Plan Note (Addendum)
Wt Readings from Last 3 Encounters:  01/28/23 203 lb 12.8 oz (92.4 kg)  01/14/23 199 lb (90.3 kg)  01/06/23 201 lb (91.2 kg)   Body mass index is 39.8 kg/m.   She has established  care with the weight management specialist, now following a diet plan, she exercises twice a week does  weight lifting,cardio, has a trainer. She is  trying to do 4 days  of exercises/week.  Patient counseled on low-carb modified diet Encouraged to engage in regular moderate to vigorous exercises at least 150 minutes weekly Patient encouraged to maintain close follow-up with his staff at the weight management clinic

## 2023-01-28 NOTE — Assessment & Plan Note (Signed)
Lab Results  Component Value Date   CHOL 205 (H) 01/14/2023   HDL 66 01/14/2023   LDLCALC 122 (H) 01/14/2023   TRIG 93 01/14/2023   CHOLHDL 2.7 08/14/2022  Avoid fatty fried foods, lose weight

## 2023-01-28 NOTE — Assessment & Plan Note (Signed)
Last vitamin D Lab Results  Component Value Date   VD25OH 17.7 (L) 01/14/2023  Vitamin D level has improved from 6.0-17.7 Will do vitamin D 50,000 units once weekly for 8 weeks After 8 weeks take vitamin D 1000 units daily Increase intake of foods rich in vitamin D

## 2023-01-28 NOTE — Assessment & Plan Note (Signed)
Both parents have sleep apnea Home sleep study ordered to screen for sleep apnea

## 2023-01-28 NOTE — Assessment & Plan Note (Signed)
Smokes on and off, 2 cirateets every 2 -3 weeks.  Complete cessation encouraged

## 2023-01-28 NOTE — Progress Notes (Signed)
Established Patient Office Visit  Subjective:  Patient ID: Stacey Kerr, female    DOB: 1990/05/24  Age: 32 y.o. MRN: [id number]  CC:  Chief Complaint  Patient presents with   Follow-up    HPI Stacey Kerr is a 32 y.o. female  has a past medical history of Abnormal uterine bleeding, ASCUS with positive high risk HPV cervical, Cervical dysplasia, Insomnia (01/28/2023), Joint pain, Obesity (08/14/2022), Obesity (BMI 35.0-39.9 without comorbidity), Occasional tobacco smoker (08/14/2022), Prediabetes (10/28/2022), and Vitamin D deficiency.  Patient presents for follow-up for vitamin D deficiency.  She has completed full course of vitamin D 50,000 units that was ordered.   Patient complains of snoring, gets about 6 hours of sleep nightly, does not feel well rested when she wakes up in the morning, never been told that she quits breathing in her sleep.  She denies trouble falling asleep.  Epworth sleepiness scale was 13 when screened by the weight management specialist.       Past Medical History:  Diagnosis Date   Abnormal uterine bleeding    ASCUS with positive high risk HPV cervical    Cervical dysplasia    Insomnia 01/28/2023   Joint pain    Obesity 08/14/2022   Obesity (BMI 35.0-39.9 without comorbidity)    Occasional tobacco smoker 08/14/2022   Prediabetes 10/28/2022   Vitamin D deficiency     Past Surgical History:  Procedure Laterality Date   CESAREAN SECTION     FEMUR FRACTURE SURGERY Right 2012   LEG SURGERY     PELVIC FRACTURE SURGERY      Family History  Problem Relation Age of Onset   Hyperlipidemia Mother    Hypertension Mother    Sleep apnea Mother    Sleep apnea Father    Hypertension Father    Anxiety disorder Father    Colon cancer Maternal Grandfather    Breast cancer Neg Hx    Cervical cancer Neg Hx     Social History   Socioeconomic History   Marital status: Single    Spouse name: Not on file   Number of children: 1   Years  of education: Not on file   Highest education level: Bachelor's degree (e.g., BA, AB, BS)  Occupational History   Occupation: Psychologist, sport and exercise at [employer]  Tobacco Use   Smoking status: Some Days   Smokeless tobacco: Not on file   Tobacco comments:    Socially smokes a cigarettes every 4 months.   Substance and Sexual Activity   Alcohol use: Yes    Comment: socially    Drug use: No   Sexual activity: Yes  Other Topics Concern   Not on file  Social History Narrative   Lives with parents    Social Determinants of Health   Financial Resource Strain: Low Risk  (10/28/2022)   Overall Financial Resource Strain (CARDIA)    Difficulty of Paying Living Expenses: Not very hard  Food Insecurity: No Food Insecurity (10/28/2022)   Hunger Vital Sign    Worried About Running Out of Food in the Last Year: Never true    Ran Out of Food in the Last Year: Never true  Transportation Needs: No Transportation Needs (10/28/2022)   PRAPARE - Administrator, Civil Service (Medical): No    Lack of Transportation (Non-Medical): No  Physical Activity: Insufficiently Active (10/28/2022)   Exercise Vital Sign    Days of Exercise per Week: 3 days    Minutes of Exercise per  Session: 30 min  Stress: Stress Concern Present (10/28/2022)   [name]    Feeling of Stress : To some extent  Social Connections: Moderately Integrated (10/28/2022)   Social Connection and Isolation Panel [NHANES]    Frequency of Communication with Friends and Family: More than three times a week    Frequency of Social Gatherings with Friends and Family: Once a week    Attends Religious Services: 1 to 4 times per year    Active Member of [employer] or Organizations: Yes    Attends Banker Meetings: 1 to 4 times per year    Marital Status: Never married  Intimate Partner Violence: Unknown (07/30/2021)   Received from [employer], [facility]   HITS     Physically Hurt: Not on file    Insult or Talk Down To: Not on file    Threaten Physical Harm: Not on file    Scream or Curse: Not on file    Outpatient Medications Prior to Visit  Medication Sig Dispense Refill   Etonogestrel (IMPLANON [place]) Inject into the skin.     No facility-administered medications prior to visit.    No Known Allergies  ROS Review of Systems  Constitutional:  Positive for fatigue. Negative for activity change, appetite change, chills and diaphoresis.  HENT: Negative.    Respiratory: Negative.  Negative for apnea, cough, choking and chest tightness.   Cardiovascular: Negative.  Negative for chest pain, palpitations and leg swelling.  Gastrointestinal: Negative.  Negative for abdominal distention, abdominal pain and anal bleeding.  Genitourinary: Negative.   Musculoskeletal: Negative.   Skin: Negative.   Neurological: Negative.   Psychiatric/Behavioral:  Positive for sleep disturbance. Negative for agitation, behavioral problems, self-injury and suicidal ideas.       Objective:    Physical Exam Vitals and nursing note reviewed.  Constitutional:      General: She is not in acute distress.    Appearance: Normal appearance. She is obese. She is not ill-appearing, toxic-appearing or diaphoretic.  HENT:     Mouth/Throat:     Mouth: Mucous membranes are moist.     Pharynx: Oropharynx is clear. No oropharyngeal exudate or posterior oropharyngeal erythema.  Eyes:     General: No scleral icterus.       Right eye: No discharge.        Left eye: No discharge.     Extraocular Movements: Extraocular movements intact.     Conjunctiva/sclera: Conjunctivae normal.  Cardiovascular:     Rate and Rhythm: Normal rate and regular rhythm.     Pulses: Normal pulses.     Heart sounds: Normal heart sounds. No murmur heard.    No friction rub. No gallop.  Pulmonary:     Effort: Pulmonary effort is normal. No respiratory distress.     Breath sounds: Normal breath  sounds. No stridor. No wheezing, rhonchi or rales.  Chest:     Chest wall: No tenderness.  Abdominal:     General: There is no distension.     Palpations: Abdomen is soft.     Tenderness: There is no abdominal tenderness. There is no right CVA tenderness, left CVA tenderness or guarding.  Musculoskeletal:        General: No swelling, tenderness, deformity or signs of injury.     Right lower leg: No edema.     Left lower leg: No edema.  Skin:    General: Skin is warm and dry.  Capillary Refill: Capillary refill takes less than 2 seconds.     Coloration: Skin is not jaundiced or pale.     Findings: No bruising, erythema or lesion.  Neurological:     Mental Status: She is alert and oriented to person, place, and time.     Motor: No weakness.     Coordination: Coordination normal.     Gait: Gait normal.  Psychiatric:        Mood and Affect: Mood normal.        Behavior: Behavior normal.        Thought Content: Thought content normal.        Judgment: Judgment normal.     BP 118/61   Pulse 73   Resp 16   Ht 5' (1.524 m)   Wt 203 lb 12.8 oz (92.4 kg)   SpO2 100%   BMI 39.80 kg/m  Wt Readings from Last 3 Encounters:  01/28/23 203 lb 12.8 oz (92.4 kg)  01/14/23 199 lb (90.3 kg)  01/06/23 201 lb (91.2 kg)    Lab Results  Component Value Date   TSH 1.580 01/14/2023   Lab Results  Component Value Date   WBC 7.7 01/14/2023   HGB 12.9 01/14/2023   HCT 40.4 01/14/2023   MCV 87 01/14/2023   PLT 355 01/14/2023   Lab Results  Component Value Date   NA 141 01/14/2023   K 4.1 01/14/2023   CO2 22 01/14/2023   GLUCOSE 79 01/14/2023   BUN 9 01/14/2023   CREATININE 0.84 01/14/2023   BILITOT 0.2 01/14/2023   ALKPHOS 78 01/14/2023   AST 18 01/14/2023   ALT 29 01/14/2023   PROT 6.9 01/14/2023   ALBUMIN 4.5 01/14/2023   CALCIUM 9.4 01/14/2023   EGFR 93 01/14/2023   Lab Results  Component Value Date   CHOL 205 (H) 01/14/2023   Lab Results  Component Value Date    HDL 66 01/14/2023   Lab Results  Component Value Date   LDLCALC 122 (H) 01/14/2023   Lab Results  Component Value Date   TRIG 93 01/14/2023   Lab Results  Component Value Date   CHOLHDL 2.7 08/14/2022   Lab Results  Component Value Date   HGBA1C 5.8 (H) 01/14/2023      Assessment & Plan:   Problem List Items Addressed This Visit       Other   Obesity    Wt Readings from Last 3 Encounters:  01/28/23 203 lb 12.8 oz (92.4 kg)  01/14/23 199 lb (90.3 kg)  01/06/23 201 lb (91.2 kg)   Body mass index is 39.8 kg/m.   She has established  care with the weight management specialist, now following a diet plan, she exercises twice a week does  weight lifting,cardio, has a trainer. She is  trying to do 4 days  of exercises/week.  Patient counseled on low-carb modified diet Encouraged to engage in regular moderate to vigorous exercises at least 150 minutes weekly Patient encouraged to maintain close follow-up with his staff at the weight management clinic      Occasional tobacco smoker    Smokes on and off, 2 cirateets every 2 -3 weeks.  Complete cessation encouraged      Vitamin D deficiency    Last vitamin D Lab Results  Component Value Date   VD25OH 17.7 (L) 01/14/2023  Vitamin D level has improved from 6.0-17.7 Will do vitamin D 50,000 units once weekly for 8 weeks After 8 weeks take vitamin  D 1000 units daily Increase intake of foods rich in vitamin D      Relevant Medications   Vitamin D, Ergocalciferol, (DRISDOL) 1.25 MG (50000 UNIT) CAPS capsule   Insomnia - Primary    Both parents have sleep apnea Home sleep study ordered to screen for sleep apnea      Relevant Orders   Home sleep test   Hyperlipidemia    Lab Results  Component Value Date   CHOL 205 (H) 01/14/2023   HDL 66 01/14/2023   LDLCALC 122 (H) 01/14/2023   TRIG 93 01/14/2023   CHOLHDL 2.7 08/14/2022  Avoid fatty fried foods, lose weight       Meds ordered this encounter  Medications    Vitamin D, Ergocalciferol, (DRISDOL) 1.25 MG (50000 UNIT) CAPS capsule    Sig: Take 1 capsule (50,000 Units total) by mouth every 7 (seven) days.    Dispense:  8 capsule    Refill:  0    Follow-up: Return in about 7 months (around 08/28/2023) for CPE.    Donell Beers, FNP

## 2023-01-29 ENCOUNTER — Ambulatory Visit (INDEPENDENT_AMBULATORY_CARE_PROVIDER_SITE_OTHER): Payer: Commercial Managed Care - PPO | Admitting: Family Medicine

## 2023-01-29 ENCOUNTER — Encounter (INDEPENDENT_AMBULATORY_CARE_PROVIDER_SITE_OTHER): Payer: Self-pay | Admitting: Family Medicine

## 2023-01-29 VITALS — BP 114/77 | HR 100 | Temp 98.8°F | Ht 60.0 in | Wt 200.0 lb

## 2023-01-29 DIAGNOSIS — F3289 Other specified depressive episodes: Secondary | ICD-10-CM | POA: Diagnosis not present

## 2023-01-29 DIAGNOSIS — Z6839 Body mass index (BMI) 39.0-39.9, adult: Secondary | ICD-10-CM

## 2023-01-29 DIAGNOSIS — R5383 Other fatigue: Secondary | ICD-10-CM

## 2023-01-29 DIAGNOSIS — E7841 Elevated Lipoprotein(a): Secondary | ICD-10-CM | POA: Diagnosis not present

## 2023-01-29 DIAGNOSIS — E559 Vitamin D deficiency, unspecified: Secondary | ICD-10-CM

## 2023-01-29 DIAGNOSIS — E669 Obesity, unspecified: Secondary | ICD-10-CM | POA: Diagnosis not present

## 2023-01-29 DIAGNOSIS — R7303 Prediabetes: Secondary | ICD-10-CM | POA: Diagnosis not present

## 2023-01-29 MED ORDER — METFORMIN HCL 500 MG PO TABS: ORAL_TABLET | ORAL | 0 refills | Status: AC

## 2023-01-29 MED ORDER — VITAMIN D (ERGOCALCIFEROL) 1.25 MG (50000 UNIT) PO CAPS: ORAL_CAPSULE | ORAL | 0 refills | Status: AC

## 2023-01-29 NOTE — Progress Notes (Signed)
Stacey Kerr, D.O.  ABFM, ABOM Clinical Bariatric Medicine Physician  Office located at: [address] [place], [place]  [address]     Assessment and Plan:   Medications Discontinued During This Encounter  Medication Reason   Vitamin D, Ergocalciferol, (DRISDOL) 1.25 MG (50000 UNIT) CAPS capsule Reorder    Meds ordered this encounter  Medications   metFORMIN (GLUCOPHAGE) 500 MG tablet    Sig: 1/2 po with lunch daily    Dispense:  30 tablet    Refill:  0    30 d supply;  ** OV for RF **   Do not send RF request   Vitamin D, Ergocalciferol, (DRISDOL) 1.25 MG (50000 UNIT) CAPS capsule    Sig: 1 PO Q SUN AND WED    Dispense:  8 capsule    Refill:  0    Prediabetes Assessment: Condition is Not at goal.. This is diet/exercise controlled. Pt A1c increased from 5.7 to 5.8 within 5 months. Pt insulin level is extremely elevated at 15.2 as of 01/14/2023. Pt CBC, CMP, and thyroid levels are normal.  Lab Results  Component Value Date   HGBA1C 5.8 (H) 01/14/2023   HGBA1C 5.7 (H) 08/14/2022   INSULIN 15.2 01/14/2023   Lab Results  Component Value Date   WBC 7.7 01/14/2023   HGB 12.9 01/14/2023   HCT 40.4 01/14/2023   MCV 87 01/14/2023   PLT 355 01/14/2023   Lab Results  Component Value Date   CREATININE 0.84 01/14/2023   BUN 9 01/14/2023   NA 141 01/14/2023   K 4.1 01/14/2023   CL 104 01/14/2023   CO2 22 01/14/2023      Component Value Date/Time   PROT 6.9 01/14/2023 1032   ALBUMIN 4.5 01/14/2023 1032   AST 18 01/14/2023 1032   ALT 29 01/14/2023 1032   ALKPHOS 78 01/14/2023 1032   BILITOT 0.2 01/14/2023 1032   Lab Results  Component Value Date   TSH 1.580 01/14/2023   T3TOTAL 124 01/14/2023   Component Ref Range & Units 01/14/2023  Free T4 0.82 - 1.77 ng/dL 8.41     Plan: - Begin Metformin 500mg  half a tablet daily for 3-4 days and then increase to half a tablet twice daily at lunch and dinner if tolerated.   - In addition, we discussed the  risks and benefits of various medication options such as Metformin which can help [place] in the management of this disease process as well as with weight loss.  Will consider starting one of these meds in future as we will focus on prudent nutritional plan at this time.   - Explained role of simple carbs and insulin levels on hunger and cravings.  - Handouts provided at pt's request after education provided.  All concerns/questions addressed.    - Anticipatory guidance given.    - Reminded Stacey Kerr that lifestyle changes are the first line treatment option for most all disease processes.  Education provided that "food is medicine" and I encouraged her to be mindful of how certain foods can improve or worsen her medical conditions.    - Labs were reviewed with patient today and education provided on them. We discussed how the foods patient eats may influence these laboratory findings.  All of the patient's questions about them were answered    Vitamin D deficiency- new diagnosis Assessment: Condition is Not at goal.. Pt vitamin D level is below the recommended range at 17.7 as of 01/14/2023. She was  placed on Ergocalciferol 50K IU weekly on 01/29/2023 and has not started it yet. Pt folate and B-12 level are within optimal range.  Lab Results  Component Value Date   VD25OH 17.7 (L) 01/14/2023   VD25OH 6.0 (L) 08/14/2022   Lab Results  Component Value Date   VITAMINB12 600 01/14/2023   Component Ref Range & Units 01/14/2023  Folate >3.0 ng/mL 9.5     Plan: - Begin Ergocalciferol 50K IU twice weekly and continue until told otherwise.  - I reviewed possible symptoms of low Vitamin D:  low energy, depressed mood, muscle aches, joint aches, osteoporosis etc. with patient  - It has been show that administration of vitamin D supplementation leads to improved satiety and a decrease in inflammatory markers.  Hence, low Vitamin D levels may be linked to an increased risk of  cardiovascular events and even increased risk of cancers- such as colon and breast.  - ideal vitamin D levels reviewed with patient    - weight loss will likely improve availability of vitamin D, thus encouraged Stacey Kerr to continue with meal plan and their weight loss efforts to further improve this condition.  Thus, we will need to monitor levels regularly (every 3-4 mo on average) to keep levels within normal limits and prevent over supplementation.  - Labs were reviewed with patient today and education provided on them. We discussed how the foods patient eats may influence these laboratory findings.  All of the patient's questions about them were answered    Elevated lipoprotein(a)- - new diagnosis  Assessment: Condition is Not at goal.. This is diet/exercise controlled. Pt LDL is elevated at 122 as of 01/14/2023. However, her HDL is in a good range at 66.  Lab Results  Component Value Date   CHOL 205 (H) 01/14/2023   HDL 66 01/14/2023   LDLCALC 122 (H) 01/14/2023   TRIG 93 01/14/2023   CHOLHDL 2.7 08/14/2022   Plan: - Stacey Kerr agrees to continue with meds and/or our treatment plan of a heart-heathy, low cholesterol meal plan  - I stressed the importance that patient continue with our prudent nutritional plan that is low in saturated and trans fats, and low in fatty carbs to improve these numbers.   - We will continue routine screening as patient continues to achieve health goals along their weight loss journey   - Labs were reviewed with patient today and education provided on them. We discussed how the foods patient eats may influence these laboratory findings.  All of the patient's questions about them were answered    Other fatigue Assessment: Condition is Improving, but not optimized.. Pt has a ESS score of 13 and was recommended follow-up with her PCP. She has meet with her and has a sleep study scheduled.    Plan: - Follow-up after sleep study with PCP and me.     Hx of depression with emotional eating Assessment: Condition is ok. Denies any SI/HI. Mood is stable. Cravings and hunger are semi well controlled. This is diet/exercise controlled. She notes not being opposed to taking medication for help but feels as she can lose weight without it.   Plan: - Discussed how thoughts affect eating habits, modeling of thoughts, feelings, and behaviors, and strategies for change.  Importance of not skipping meals and getting all her proteins and fiber in on a daily basis discussed.    - Educational handouts given to pt at their request.  - Reminded patient of the importance of following their prudent  nutrition plan and how food can affect mood as well to support emotional wellbeing.    TREATMENT PLAN FOR OBESITY: Obesity (BMI 30-39.9), Starting BMI 39.26 Assessment:  Stacey Kerr is here to discuss her progress with her obesity treatment plan along with follow-up of her obesity related diagnoses. See Medical Weight Management Flowsheet for complete bioelectrical impedance results.  Condition is not optimized.   Since last office visit patient's  Muscle mass has decreased by 1.6lb. Fat mass has increased by 3.4lb. Total body water has increased by 1.4lb.  Counseling done on how various foods will affect these numbers and how to maximize success  Total lbs lost to date: +1 Total weight loss percentage to date: +0.50   Plan: - Continue to follow the Category 1 Plan and keeping a food journal and adhering to recommended goals of 1000-1100 calories and 80++ protein best they can.   - I recommended the loseit or myitness pal app to track her calories and protein.   - I also recommended low car bagels if she wants that for breakfast.   Behavioral Intervention Additional resources provided today: category 1 meal plan information Evidence-based interventions for health behavior change were utilized today including the discussion of self monitoring  techniques, problem-solving barriers and SMART goal setting techniques.   Regarding patient's less desirable eating habits and patterns, we employed the technique of small changes.  Pt will specifically work on: journal and bring in food log for next visit.   FOLLOW UP: Return in about 2 weeks (around 02/12/2023). She was informed of the importance of frequent follow up visits to maximize her success with intensive lifestyle modifications for her multiple health conditions.  Subjective:   Chief complaint: Obesity Stacey Kerr is here to discuss her progress with her obesity treatment plan. She is on the Category 1 Plan and keeping a food journal and adhering to recommended goals of 1000-1100 calories and 80+ protein and states she is following her eating plan approximately 0 % of the time. She states she is doing cardio and weights 90 minutes 2 days per week.  Interval History:  Stacey Kerr is here today for her first follow-up office visit since starting the program with [place].  Since last office visit she endorses not doing well on the meal plan. She is not sure if she is eating enough or over-eating. She notes eating better during breakfast and lunch. She has 2 oiled eggs, yogurt and/or a piece of bagel for breakfast.    All blood work/ lab tests that were recently ordered by myself or an outside provider were reviewed with patient today per their request. Extended time was spent counseling her on all new disease processes that were discovered or preexisting ones that are affected by BMI.  she understands that many of these abnormalities will need to monitored regularly along with the current treatment plan of prudent dietary changes, in which we are making each and every office visit, to improve these health parameters.   Review of Systems:  Pertinent positives were addressed with patient today.  Reviewed by clinician on day of visit: allergies, medications, problem list, medical history,  surgical history, family history, social history, and previous encounter notes.  Weight Summary and Biometrics   Weight Lost Since Last Visit: 0lb  Weight Gained Since Last Visit: 1lb   Vitals Temp: 98.8 F (37.1 C) BP: 114/77 Pulse Rate: 100 SpO2: 99 %   Anthropometric Measurements Height: 5' (1.524 m) Weight: 200 lb (90.7 kg) BMI (Calculated):  39.06 Weight at Last Visit: 199lb Weight Lost Since Last Visit: 0lb Weight Gained Since Last Visit: 1lb Starting Weight: 199lb Total Weight Loss (lbs): 0 lb (0 kg) Peak Weight: 203lb   Body Composition  Body Fat %: 43.3 % Fat Mass (lbs): 87 lbs Muscle Mass (lbs): 108 lbs Total Body Water (lbs): 79.2 lbs Visceral Fat Rating : 11   Other Clinical Data Fasting: no Labs: no Today's Visit #: 2 Starting Date: 01/14/23     Objective:   PHYSICAL EXAM:  Blood pressure 114/77, pulse 100, temperature 98.8 F (37.1 C), height 5' (1.524 m), weight 200 lb (90.7 kg), SpO2 99%. Body mass index is 39.06 kg/m.  General: Well Developed, well nourished, and in no acute distress.  HEENT: Normocephalic, atraumatic Skin: Warm and dry, cap RF less 2 sec, good turgor Chest:  Normal excursion, shape, no gross abn Respiratory: speaking in full sentences, no conversational dyspnea NeuroM-Sk: Ambulates w/o assistance, moves * 4 Psych: A and O *3, insight good, mood-full  DIAGNOSTIC DATA REVIEWED:  BMET    Component Value Date/Time   NA 141 01/14/2023 1032   K 4.1 01/14/2023 1032   CL 104 01/14/2023 1032   CO2 22 01/14/2023 1032   GLUCOSE 79 01/14/2023 1032   BUN 9 01/14/2023 1032   CREATININE 0.84 01/14/2023 1032   CALCIUM 9.4 01/14/2023 1032   Lab Results  Component Value Date   HGBA1C 5.8 (H) 01/14/2023   HGBA1C 5.7 (H) 08/14/2022   Lab Results  Component Value Date   INSULIN 15.2 01/14/2023   Lab Results  Component Value Date   TSH 1.580 01/14/2023   CBC    Component Value Date/Time   WBC 7.7 01/14/2023 1032    RBC 4.63 01/14/2023 1032   HGB 12.9 01/14/2023 1032   HCT 40.4 01/14/2023 1032   PLT 355 01/14/2023 1032   MCV 87 01/14/2023 1032   MCH 27.9 01/14/2023 1032   MCHC 31.9 01/14/2023 1032   RDW 12.9 01/14/2023 1032   Iron Studies No results found for: "IRON", "TIBC", "FERRITIN", "IRONPCTSAT" Lipid Panel     Component Value Date/Time   CHOL 205 (H) 01/14/2023 1032   TRIG 93 01/14/2023 1032   HDL 66 01/14/2023 1032   CHOLHDL 2.7 08/14/2022 0901   LDLCALC 122 (H) 01/14/2023 1032   Hepatic Function Panel     Component Value Date/Time   PROT 6.9 01/14/2023 1032   ALBUMIN 4.5 01/14/2023 1032   AST 18 01/14/2023 1032   ALT 29 01/14/2023 1032   ALKPHOS 78 01/14/2023 1032   BILITOT 0.2 01/14/2023 1032      Component Value Date/Time   TSH 1.580 01/14/2023 1032   Nutritional Lab Results  Component Value Date   VD25OH 17.7 (L) 01/14/2023   VD25OH 6.0 (L) 08/14/2022    Attestations:   Reviewed by clinician on day of visit: allergies, medications, problem list, medical history, surgical history, family history, social history, and previous encounter notes.   Patient was in the office today and time spent on visit including pre-visit chart review and post-visit care/coordination of care and electronic medical record documentation was 61 minutes. 50% of the time was in face to face counseling of this patient's medical condition(s) and providing education on treatment options to include the first-line treatment of diet and lifestyle modification.  I, Clinical biochemist, acting as a Stage manager for [employer], DO., have compiled all relevant documentation for today's office visit on behalf of Thomasene Lot, DO, while in the  presence of [employer], DO.  I have reviewed the above documentation for accuracy and completeness, and I agree with the above. Stacey Kerr, D.O.  The 21st Century Cures Act was signed into law in 2016 which includes the topic of electronic  health records.  This provides immediate access to information in MyChart.  This includes consultation notes, operative notes, office notes, lab results and pathology reports.  If you have any questions about what you read please let us know at your next visit so we can discuss your concerns and take corrective action if need be.  We are right here with you.

## 2023-02-12 ENCOUNTER — Ambulatory Visit (INDEPENDENT_AMBULATORY_CARE_PROVIDER_SITE_OTHER): Payer: Commercial Managed Care - PPO | Admitting: Family Medicine

## 2023-02-17 ENCOUNTER — Ambulatory Visit (INDEPENDENT_AMBULATORY_CARE_PROVIDER_SITE_OTHER): Payer: Commercial Managed Care - PPO | Admitting: Family Medicine

## 2023-02-17 NOTE — Progress Notes (Incomplete)
[name], D.O.  ABFM, ABOM Specializing in Clinical Bariatric Medicine  Office located at: [address] [place], [place]  [address]     Assessment and Plan:   No orders of the defined types were placed in this encounter.   There are no discontinued medications.   No orders of the defined types were placed in this encounter.    *** There are no diagnoses linked to this encounter.     TREATMENT PLAN FOR OBESITY:  Assessment:  Stacey Kerr is here to discuss her progress with her obesity treatment plan along with follow-up of her obesity related diagnoses. See Medical Weight Management Flowsheet for complete bioelectrical impedance results.  Condition is {docourse:29403:::1}. Biometric data collected today, was reviewed with patient.   Since last office visit on *** patient's  Muscle mass has {DID:29233} by ***lb. Fat mass has {DID:29233} by ***lb. Total body water has {DID:29233} by ***lb.  Counseling done on how various foods will affect these numbers and how to maximize success  Total lbs lost to date: *** Total weight loss percentage to date: ***   Plan:  Stacey Kerr is currently in the action stage of change. As such, her goal is to continue her weight management plan. Luv will work on healthier eating habits and try to follow the {mealplan:29239} best they can.   Behavioral Intervention Additional resources provided today: {weightresources:29185} Evidence-based interventions for health behavior change were utilized today including the discussion of self monitoring techniques, problem-solving barriers and SMART goal setting techniques.   Regarding patient's less desirable eating habits and patterns, we employed the technique of small changes.  Pt will specifically work on: *** for next visit.     She has agreed to {EMEXERCISE:28847::"Think about enjoyable ways to increase daily physical activity and overcoming barriers to exercise","Increase  physical activity in their day and reduce sedentary time (increase NEAT)."}   FOLLOW UP: No follow-ups on file.  She was informed of the importance of frequent follow up visits to maximize her success with intensive lifestyle modifications for her multiple health conditions.  Subjective:   Chief complaint: Obesity Stacey Kerr is here to discuss her progress with her obesity treatment plan. She is on the the Category 1 Plan and keeping a food journal and adhering to recommended goals of 1000-1100 calories and 80++ protein and states she is following her eating plan approximately ***% of the time. She states she is exercising *** minutes *** days per week.  Interval History:  Stacey Kerr is here for a follow up office visit.     Since last office visit:  ***    We reviewed her meal plan and all questions were answered.    Pharmacotherapy for weight loss: She is currently taking  Metformin  for medical weight loss.  Denies side effects.    Review of Systems:  Pertinent positives were addressed with patient today.  Reviewed by clinician on day of visit: allergies, medications, problem list, medical history, surgical history, family history, social history, and previous encounter notes.  Weight Summary and Biometrics   No data recorded No data recorded  No data recorded No data recorded No data recorded No data recorded   Objective:   PHYSICAL EXAM: There were no vitals taken for this visit. There is no height or weight on file to calculate BMI.  General: Well Developed, well nourished, and in no acute distress.  HEENT: Normocephalic, atraumatic Skin: Warm and dry, cap RF less 2 sec, good turgor Chest:  Normal excursion, shape, no gross abn Respiratory: speaking in full sentences, no conversational dyspnea NeuroM-Sk: Ambulates w/o assistance, moves * 4 Psych: A and O *3, insight good, mood-full  DIAGNOSTIC DATA REVIEWED:  BMET    Component Value Date/Time    NA 141 01/14/2023 1032   K 4.1 01/14/2023 1032   CL 104 01/14/2023 1032   CO2 22 01/14/2023 1032   GLUCOSE 79 01/14/2023 1032   BUN 9 01/14/2023 1032   CREATININE 0.84 01/14/2023 1032   CALCIUM 9.4 01/14/2023 1032   Lab Results  Component Value Date   HGBA1C 5.8 (H) 01/14/2023   HGBA1C 5.7 (H) 08/14/2022   Lab Results  Component Value Date   INSULIN 15.2 01/14/2023   Lab Results  Component Value Date   TSH 1.580 01/14/2023   CBC    Component Value Date/Time   WBC 7.7 01/14/2023 1032   RBC 4.63 01/14/2023 1032   HGB 12.9 01/14/2023 1032   HCT 40.4 01/14/2023 1032   PLT 355 01/14/2023 1032   MCV 87 01/14/2023 1032   MCH 27.9 01/14/2023 1032   MCHC 31.9 01/14/2023 1032   RDW 12.9 01/14/2023 1032   Iron Studies No results found for: "IRON", "TIBC", "FERRITIN", "IRONPCTSAT" Lipid Panel     Component Value Date/Time   CHOL 205 (H) 01/14/2023 1032   TRIG 93 01/14/2023 1032   HDL 66 01/14/2023 1032   CHOLHDL 2.7 08/14/2022 0901   LDLCALC 122 (H) 01/14/2023 1032   Hepatic Function Panel     Component Value Date/Time   PROT 6.9 01/14/2023 1032   ALBUMIN 4.5 01/14/2023 1032   AST 18 01/14/2023 1032   ALT 29 01/14/2023 1032   ALKPHOS 78 01/14/2023 1032   BILITOT 0.2 01/14/2023 1032      Component Value Date/Time   TSH 1.580 01/14/2023 1032   Nutritional Lab Results  Component Value Date   VD25OH 17.7 (L) 01/14/2023   VD25OH 6.0 (L) 08/14/2022    Attestations:   This encounter took 40 total minutes of time including any pre-visit and post-visit time spent on this date of service, including taking a thorough history, reviewing any labs and/or imaging, reviewing prior notes, as well as documenting in the electronic health record on the date of service. Over 50% of that time was in direct face-to-face counseling and coordinating care for the patient today  I, Clinical biochemist, acting as a Stage manager for [employer], DO., have compiled all relevant  documentation for today's office visit on behalf of Thomasene Lot, DO, while in the presence of [employer], DO.  I have reviewed the above documentation for accuracy and completeness, and I agree with the above. [name], D.O.  The 21st Century Cures Act was signed into law in 2016 which includes the topic of electronic health records.  This provides immediate access to information in MyChart.  This includes consultation notes, operative notes, office notes, lab results and pathology reports.  If you have any questions about what you read please let us know at your next visit so we can discuss your concerns and take corrective action if need be.  We are right here with you.

## 2023-02-19 ENCOUNTER — Other Ambulatory Visit (INDEPENDENT_AMBULATORY_CARE_PROVIDER_SITE_OTHER): Payer: Self-pay | Admitting: Family Medicine

## 2023-02-19 DIAGNOSIS — E559 Vitamin D deficiency, unspecified: Secondary | ICD-10-CM

## 2023-03-04 ENCOUNTER — Ambulatory Visit (INDEPENDENT_AMBULATORY_CARE_PROVIDER_SITE_OTHER): Payer: Commercial Managed Care - PPO | Admitting: Family Medicine

## 2023-03-12 ENCOUNTER — Ambulatory Visit (HOSPITAL_BASED_OUTPATIENT_CLINIC_OR_DEPARTMENT_OTHER): Payer: Commercial Managed Care - PPO | Attending: Nurse Practitioner | Admitting: Internal Medicine

## 2023-03-12 DIAGNOSIS — G4733 Obstructive sleep apnea (adult) (pediatric): Secondary | ICD-10-CM | POA: Insufficient documentation

## 2023-03-12 DIAGNOSIS — G4700 Insomnia, unspecified: Secondary | ICD-10-CM

## 2023-03-15 DIAGNOSIS — G4700 Insomnia, unspecified: Secondary | ICD-10-CM

## 2023-03-15 NOTE — Procedures (Signed)
    Patient Name: Stacey Kerr, [place] Date: 03/12/2023 Gender: Female D.O.B: 12/07/1990 Age (years): 32 Referring Provider: Donell Beers FNP Height (inches): 60 Interpreting Physician: Jetty Duhamel MD, ABSM Weight (lbs): 203 RPSGT: [place] S[place] BMI: 40 MRN: [id number] Neck Size: <br>  CLINICAL INFORMATION Sleep Study Type: HST Indication for sleep study: OSA Epworth Sleepiness Score: N/A  SLEEP STUDY TECHNIQUE A multi-channel overnight portable sleep study was performed. The channels recorded were: nasal airflow, thoracic respiratory movement, and oxygen saturation with a pulse oximetry. Snoring was also monitored.  MEDICATIONS Patient self administered medications include: none reported.  SLEEP ARCHITECTURE Patient was studied for 383 minutes. The sleep efficiency was 100.0 % and the patient was supine for 0%. The arousal index was 0.0 per hour.  RESPIRATORY PARAMETERS The overall AHI was 12.2 per hour, with a central apnea index of 0 per hour. The oxygen nadir was 85% during sleep.  CARDIAC DATA Mean heart rate during sleep was 66.0 bpm.  IMPRESSIONS - Mild obstructive sleep apnea occurred during this study (AHI = 12.2/h). - Moderate oxygen desaturation was noted during this study (Min O2 = 85%, Mean 97%). - No snoring was audible during this study.  DIAGNOSIS - Obstructive Sleep Apnea (G47.33)  RECOMMENDATIONS - Suggest CPAP titration sleep study, autopap or a fitted oral appliance. Other options would be based on clinical judgment. - Be careful with alcohol, sedatives and other CNS depressants that may worsen sleep apnea and disrupt normal sleep architecture. - Sleep hygiene should be reviewed to assess factors that may improve sleep quality. - Weight management and regular exercise should be initiated or continued.  [Electronically signed] 03/15/2023 12:36 PM  Jetty Duhamel MD, ABSM Diplomate, American Board of Sleep Medicine NPI:  [id number]                         Jetty Duhamel Diplomate, American Board of Sleep Medicine  ELECTRONICALLY SIGNED ON:  03/15/2023, 12:24 PM [facility] PH: [phone]   FX: [phone] ACCREDITED BY THE AMERICAN ACADEMY OF SLEEP MEDICINE

## 2023-03-20 ENCOUNTER — Other Ambulatory Visit: Payer: Self-pay | Admitting: Medical Genetics

## 2023-03-20 DIAGNOSIS — Z006 Encounter for examination for normal comparison and control in clinical research program: Secondary | ICD-10-CM

## 2023-03-23 ENCOUNTER — Other Ambulatory Visit (INDEPENDENT_AMBULATORY_CARE_PROVIDER_SITE_OTHER): Payer: Self-pay | Admitting: Family Medicine

## 2023-03-23 DIAGNOSIS — R7303 Prediabetes: Secondary | ICD-10-CM

## 2023-05-07 ENCOUNTER — Encounter: Payer: Self-pay | Admitting: Emergency Medicine

## 2023-05-07 ENCOUNTER — Ambulatory Visit (INDEPENDENT_AMBULATORY_CARE_PROVIDER_SITE_OTHER): Payer: Commercial Managed Care - PPO

## 2023-05-07 ENCOUNTER — Ambulatory Visit
Admission: EM | Admit: 2023-05-07 | Discharge: 2023-05-07 | Disposition: A | Discharge status: Home / Self Care | Payer: Commercial Managed Care - PPO

## 2023-05-07 DIAGNOSIS — J329 Chronic sinusitis, unspecified: Secondary | ICD-10-CM | POA: Diagnosis not present

## 2023-05-07 DIAGNOSIS — J40 Bronchitis, not specified as acute or chronic: Secondary | ICD-10-CM

## 2023-05-07 DIAGNOSIS — R051 Acute cough: Secondary | ICD-10-CM | POA: Diagnosis not present

## 2023-05-07 DIAGNOSIS — S46812A Strain of other muscles, fascia and tendons at shoulder and upper arm level, left arm, initial encounter: Secondary | ICD-10-CM

## 2023-05-07 DIAGNOSIS — M545 Low back pain, unspecified: Secondary | ICD-10-CM

## 2023-05-07 DIAGNOSIS — R059 Cough, unspecified: Secondary | ICD-10-CM | POA: Diagnosis not present

## 2023-05-07 DIAGNOSIS — S46811A Strain of other muscles, fascia and tendons at shoulder and upper arm level, right arm, initial encounter: Secondary | ICD-10-CM | POA: Diagnosis not present

## 2023-05-07 DIAGNOSIS — R918 Other nonspecific abnormal finding of lung field: Secondary | ICD-10-CM | POA: Diagnosis not present

## 2023-05-07 MED ORDER — PREDNISONE 20 MG PO TABS: 40.0000 mg | ORAL_TABLET | Freq: Every day | ORAL | 0 refills | Status: AC

## 2023-05-07 MED ORDER — AMOXICILLIN-POT CLAVULANATE 875-125 MG PO TABS: 1.0000 | ORAL_TABLET | Freq: Two times a day (BID) | ORAL | 0 refills | Status: DC

## 2023-05-07 MED ORDER — CYCLOBENZAPRINE HCL 10 MG PO TABS: 10.0000 mg | ORAL_TABLET | Freq: Two times a day (BID) | ORAL | 0 refills | Status: AC | PRN

## 2023-05-07 NOTE — ED Triage Notes (Signed)
 Pt here to be seen for productive cough and for a fall. Worsening cough x2 weeks with congestion and green sputum. Some relief with mucinex dm.   Pt fell yesterday picking up son and stepped onto outdoor ramp where she slid and fell on R side. Aching soreness down R side of body from neck/back down to R leg.

## 2023-05-08 NOTE — ED Provider Notes (Signed)
 [facility]    CSN: [id number] Arrival date & time: 05/07/23  1504      History   Chief Complaint Chief Complaint  Patient presents with   Fall   Cough    HPI Stacey Kerr is a 33 y.o. female.   Here today for evaluation of cough that she has had for the last 2 weeks.  She notes that cough is productive of green sputum.  She has tried Mucinex DM with mild relief.  She has not had fever.  Also reports a fall yesterday.  She was picking up her son and when she slipped on the outdoor ramp she fell onto her right side.  She reports some aching down the right side of her body from her neck and back down to her right leg.  She is able to walk without difficulty.  She does report that movement worsens pain.  The history is provided by the patient.  Fall Pertinent negatives include no abdominal pain and no shortness of breath.  Cough Associated symptoms: myalgias   Associated symptoms: no chills, no ear pain, no eye discharge, no fever, no shortness of breath, no sore throat and no wheezing     Past Medical History:  Diagnosis Date   Abnormal uterine bleeding    ASCUS with positive high risk HPV cervical    Cervical dysplasia    Insomnia 01/28/2023   Joint pain    Obesity 08/14/2022   Obesity (BMI 35.0-39.9 without comorbidity)    Occasional tobacco smoker 08/14/2022   Prediabetes 10/28/2022   Vitamin D  deficiency     Patient Active Problem List   Diagnosis Date Noted   Insomnia 01/28/2023   Hyperlipidemia 01/28/2023   Screening for cervical cancer 10/28/2022   Vitamin D  deficiency 10/28/2022   Prediabetes 10/28/2022   Obesity 08/14/2022   ASCUS with positive high risk HPV cervical 08/14/2022   Annual physical exam 08/14/2022   Occasional tobacco smoker 08/14/2022    Past Surgical History:  Procedure Laterality Date   CESAREAN SECTION     FEMUR FRACTURE SURGERY Right 2012   LEG SURGERY     PELVIC FRACTURE SURGERY      OB History      Gravida  1   Para      Term      Preterm      AB      Living         SAB      IAB      Ectopic      Multiple      Live Births               Home Medications    Prior to Admission medications   Medication Sig Start Date End Date Taking? Authorizing Provider  amoxicillin -clavulanate (AUGMENTIN ) 875-125 MG tablet Take 1 tablet by mouth every 12 (twelve) hours. 05/07/23  Yes Billy Asberry FALCON, PA-C  cyclobenzaprine  (FLEXERIL ) 10 MG tablet Take 1 tablet (10 mg total) by mouth 2 (two) times daily as needed for muscle spasms. 05/07/23  Yes Billy Asberry FALCON, PA-C  Etonogestrel (IMPLANON [place]) Inject into the skin.   Yes [provider]  ibuprofen  (ADVIL ) 800 MG tablet Take 800 mg by mouth every 6 (six) hours as needed. 03/07/23  Yes [provider]  predniSONE  (DELTASONE ) 20 MG tablet Take 2 tablets (40 mg total) by mouth daily with breakfast for 5 days. 05/07/23 05/12/23 Yes Billy Asberry FALCON, PA-C  chlorhexidine (PERIDEX) 0.12 %  solution SMARTSIG:By Mouth Patient not taking: Reported on 05/07/2023 03/19/23   [provider]  metFORMIN  (GLUCOPHAGE ) 500 MG tablet 1/2 po with lunch daily Patient not taking: Reported on 05/07/2023 01/29/23   Midge Sober, DO  penicillin v potassium (VEETID) 500 MG tablet Take 500 mg by mouth 4 (four) times daily. Patient not taking: Reported on 05/07/2023 03/07/23   [provider]  Vitamin D , Ergocalciferol , (DRISDOL ) 1.25 MG (50000 UNIT) CAPS capsule 1 PO Q SUN AND WED 01/29/23   Midge Sober, DO    Family History Family History  Problem Relation Age of Onset   Hyperlipidemia Mother    Hypertension Mother    Sleep apnea Mother    Sleep apnea Father    Hypertension Father    Anxiety disorder Father    Colon cancer Maternal Grandfather    Breast cancer Neg Hx    Cervical cancer Neg Hx     Social History Social History   Tobacco Use   Smoking status: Some Days    Types: Cigarettes   Tobacco comments:     Socially smokes a cigarettes every 4 months.   Vaping Use   Vaping status: Never Used  Substance Use Topics   Alcohol use: Yes    Comment: socially    Drug use: No     Allergies   Patient has no known allergies.   Review of Systems Review of Systems  Constitutional:  Negative for chills and fever.  HENT:  Positive for congestion. Negative for ear pain and sore throat.   Eyes:  Negative for discharge and redness.  Respiratory:  Positive for cough. Negative for shortness of breath and wheezing.   Gastrointestinal:  Negative for abdominal pain, diarrhea, nausea and vomiting.  Musculoskeletal:  Positive for arthralgias, back pain, myalgias and neck pain.  Neurological:  Negative for numbness.     Physical Exam Triage Vital Signs ED Triage Vitals  Encounter Vitals Group     BP 05/07/23 1521 131/74     Systolic BP Percentile --      Diastolic BP Percentile --      Pulse Rate 05/07/23 1521 (!) 101     Resp 05/07/23 1521 18     Temp 05/07/23 1521 99.6 F (37.6 C)     Temp Source 05/07/23 1521 Oral     SpO2 05/07/23 1521 95 %     Weight --      Height --      Head Circumference --      Peak Flow --      Pain Score 05/07/23 1523 6     Pain Loc --      Pain Education --      Exclude from Growth Chart --    No data found.  Updated Vital Signs BP 131/74 (BP Location: Left Arm)   Pulse (!) 101   Temp 99.6 F (37.6 C) (Oral)   Resp 18   LMP 04/29/2023 (Approximate)   SpO2 95%   Visual Acuity Right Eye Distance:   Left Eye Distance:   Bilateral Distance:    Right Eye Near:   Left Eye Near:    Bilateral Near:     Physical Exam Vitals and nursing note reviewed.  Constitutional:      General: She is not in acute distress.    Appearance: Normal appearance. She is not ill-appearing.  HENT:     Head: Normocephalic and atraumatic.     Right Ear: Tympanic membrane normal.  Left Ear: Tympanic membrane normal.     Nose: Congestion present.     Mouth/Throat:      Mouth: Mucous membranes are moist.     Pharynx: No oropharyngeal exudate or posterior oropharyngeal erythema.  Eyes:     Conjunctiva/sclera: Conjunctivae normal.  Cardiovascular:     Rate and Rhythm: Normal rate and regular rhythm.     Heart sounds: Normal heart sounds. No murmur heard. Pulmonary:     Effort: Pulmonary effort is normal. No respiratory distress.     Breath sounds: Normal breath sounds. No wheezing, rhonchi or rales.  Musculoskeletal:     Comments: Mild tenderness to palpation to right trapezius area no tenderness palpation to midline cervical, thoracic or lumbar spine.  Skin:    General: Skin is warm and dry.  Neurological:     Mental Status: She is alert.  Psychiatric:        Mood and Affect: Mood normal.        Thought Content: Thought content normal.      UC Treatments / Results  Labs (all labs ordered are listed, but only abnormal results are displayed) Labs Reviewed - No data to display  EKG   Radiology DG Chest 2 View Result Date: 05/07/2023 CLINICAL DATA:  Cough for 2 weeks. EXAM: CHEST - 2 VIEW COMPARISON:  None Available. FINDINGS: The heart size and mediastinal contours are within normal limits. Mild linear opacity in left lower lung may be due to scarring or atelectasis. No evidence of focal consolidation or pleural effusion. The visualized skeletal structures are unremarkable. IMPRESSION: Mild left lower lung scarring versus atelectasis. No evidence of pneumonia. Electronically Signed   By: Norleen DELENA Kil M.D.   On: 05/07/2023 17:30    Procedures Procedures (including critical care time)  Medications Ordered in UC Medications - No data to display  Initial Impression / Assessment and Plan / UC Course  I have reviewed the triage vital signs and the nursing notes.  Pertinent labs & imaging results that were available during my care of the patient were reviewed by me and considered in my medical decision making (see chart for details).    Augmentin   and steroid burst prescribed to cover suspected sinobronchitis.  Chest x-ray without acute findings.  Will treat with muscle relaxer given pain following fall-last use with caution as muscle relaxer may cause drowsiness..  Low suspicion for fractures.  Advised follow-up if no gradual improvement with any further concerns.  Final Clinical Impressions(s) / UC Diagnoses   Final diagnoses:  Acute cough  Sinobronchitis  Acute bilateral low back pain without sciatica  Strain of left trapezius muscle, initial encounter   Discharge Instructions   None    ED Prescriptions     Medication Sig Dispense Auth. Provider   predniSONE  (DELTASONE ) 20 MG tablet Take 2 tablets (40 mg total) by mouth daily with breakfast for 5 days. 10 tablet Billy Asberry FALCON, PA-C   cyclobenzaprine  (FLEXERIL ) 10 MG tablet Take 1 tablet (10 mg total) by mouth 2 (two) times daily as needed for muscle spasms. 20 tablet [place] Kenley F, PA-C   amoxicillin -clavulanate (AUGMENTIN ) 875-125 MG tablet Take 1 tablet by mouth every 12 (twelve) hours. 14 tablet Billy Asberry FALCON, PA-C      PDMP not reviewed this encounter.   Billy Asberry FALCON, PA-C 05/08/23 1102

## 2023-07-10 ENCOUNTER — Other Ambulatory Visit (HOSPITAL_COMMUNITY)
Admission: RE | Admit: 2023-07-10 | Discharge: 2023-07-10 | Disposition: A | Discharge status: Home / Self Care | Payer: Self-pay | Source: Ambulatory Visit | Attending: Medical Genetics | Admitting: Medical Genetics

## 2023-07-10 DIAGNOSIS — Z006 Encounter for examination for normal comparison and control in clinical research program: Secondary | ICD-10-CM | POA: Insufficient documentation

## 2023-07-22 LAB — GENECONNECT MOLECULAR SCREEN: Genetic Analysis Overall Interpretation: NEGATIVE

## 2023-07-30 ENCOUNTER — Encounter: Payer: Self-pay | Admitting: Nurse Practitioner

## 2023-07-30 ENCOUNTER — Ambulatory Visit (INDEPENDENT_AMBULATORY_CARE_PROVIDER_SITE_OTHER): Admitting: Nurse Practitioner

## 2023-07-30 VITALS — BP 118/88 | HR 85 | Temp 97.7°F | Wt 217.6 lb

## 2023-07-30 DIAGNOSIS — G4733 Obstructive sleep apnea (adult) (pediatric): Secondary | ICD-10-CM | POA: Insufficient documentation

## 2023-07-30 MED ORDER — WEGOVY 0.25 MG/0.5ML ~~LOC~~ SOAJ: 0.2500 mg | SUBCUTANEOUS | 0 refills | Status: DC

## 2023-07-30 NOTE — Assessment & Plan Note (Signed)
 Wt Readings from Last 3 Encounters:  07/30/23 217 lb 9.6 oz (98.7 kg)  03/12/23 203 lb (92.1 kg)  01/29/23 200 lb (90.7 kg)   Body mass index is 42.5 kg/m.   Excercises  30 minutes

## 2023-07-30 NOTE — Patient Instructions (Signed)
 1. OSA (obstructive sleep apnea) (Primary)   2. Severe obesity (BMI >= 40) (HCC)  - Semaglutide-Weight Management (WEGOVY) 0.25 MG/0.5ML SOAJ; Inject 0.25 mg into the skin once a week.  Dispense: 2 mL; Refill: 0    It is important that you exercise regularly at least 30 minutes 5 times a week as tolerated  Think about what you will eat, plan ahead. Choose " clean, green, fresh or frozen" over canned, processed or packaged foods which are more sugary, salty and fatty. 70 to 75% of food eaten should be vegetables and fruit. Three meals at set times with snacks allowed between meals, but they must be fruit or vegetables. Aim to eat over a 12 hour period , example 7 am to 7 pm, and STOP after  your last meal of the day. Drink water,generally about 64 ounces per day, no other drink is as healthy. Fruit juice is best enjoyed in a healthy way, by EATING the fruit.  Thanks for choosing [facility] we consider it a privelige to serve you.

## 2023-07-30 NOTE — Progress Notes (Signed)
 Established Patient Office Visit  Subjective:  Patient ID: Stacey Kerr, female    DOB: October 01, 1990  Age: 33 y.o. MRN: [id number]  CC:  Chief Complaint  Patient presents with   Weight Loss    Patient would like to talk about weight loss medication     HPI Stacey Kerr is a 33 y.o. female  has a past medical history of Abnormal uterine bleeding, ASCUS with positive high risk HPV cervical, Cervical dysplasia, Insomnia (01/28/2023), Joint pain, Obesity (08/14/2022), Obesity (BMI 35.0-39.9 without comorbidity), Occasional tobacco smoker (08/14/2022), Prediabetes (10/28/2022), and Vitamin D deficiency.  Patient presented for follow-up for obesity  Obesity.  Patient stated that she has started exercising 2 times weekly , 30 minutes each time ,states that her diet can be better.  She was at the medical weight management clinic last year was given a diet plan.  She is past due for follow-up with the specialist at the weight management clinic. She is interested in starting medication to assist with weight loss.  Was prescribed metformin but not taking the medication.       Past Medical History:  Diagnosis Date   Abnormal uterine bleeding    ASCUS with positive high risk HPV cervical    Cervical dysplasia    Insomnia 01/28/2023   Joint pain    Obesity 08/14/2022   Obesity (BMI 35.0-39.9 without comorbidity)    Occasional tobacco smoker 08/14/2022   Prediabetes 10/28/2022   Vitamin D deficiency     Past Surgical History:  Procedure Laterality Date   CESAREAN SECTION     FEMUR FRACTURE SURGERY Right 2012   LEG SURGERY     PELVIC FRACTURE SURGERY      Family History  Problem Relation Age of Onset   Hyperlipidemia Mother    Hypertension Mother    Sleep apnea Mother    Sleep apnea Father    Hypertension Father    Anxiety disorder Father    Colon cancer Maternal Grandfather    Breast cancer Neg Hx    Cervical cancer Neg Hx     Social History   Socioeconomic  History   Marital status: Single    Spouse name: Not on file   Number of children: 1   Years of education: Not on file   Highest education level: Bachelor's degree (e.g., BA, AB, BS)  Occupational History   Occupation: Psychologist, sport and exercise at [employer]  Tobacco Use   Smoking status: Some Days    Types: Cigarettes   Smokeless tobacco: Not on file   Tobacco comments:    Socially smokes a cigarettes every 4 months.   Vaping Use   Vaping status: Never Used  Substance and Sexual Activity   Alcohol use: Yes    Comment: socially    Drug use: No   Sexual activity: Yes    Birth control/protection: Implant  Other Topics Concern   Not on file  Social History Narrative   Lives with parents    Social Drivers of Health   Financial Resource Strain: Low Risk  (10/28/2022)   Overall Financial Resource Strain (CARDIA)    Difficulty of Paying Living Expenses: Not very hard  Food Insecurity: No Food Insecurity (10/28/2022)   Hunger Vital Sign    Worried About Running Out of Food in the Last Year: Never true    Ran Out of Food in the Last Year: Never true  Transportation Needs: No Transportation Needs (10/28/2022)   PRAPARE - Transportation    Lack  of Transportation (Medical): No    Lack of Transportation (Non-Medical): No  Physical Activity: Insufficiently Active (10/28/2022)   Exercise Vital Sign    Days of Exercise per Week: 3 days    Minutes of Exercise per Session: 30 min  Stress: Stress Concern Present (10/28/2022)   [name]    Feeling of Stress : To some extent  Social Connections: Moderately Integrated (10/28/2022)   Social Connection and Isolation Panel [NHANES]    Frequency of Communication with Friends and Family: More than three times a week    Frequency of Social Gatherings with Friends and Family: Once a week    Attends Religious Services: 1 to 4 times per year    Active Member of [employer] or Organizations: Yes     Attends Banker Meetings: 1 to 4 times per year    Marital Status: Never married  Intimate Partner Violence: Unknown (07/30/2021)   Received from [employer], [facility]   HITS    Physically Hurt: Not on file    Insult or Talk Down To: Not on file    Threaten Physical Harm: Not on file    Scream or Curse: Not on file    Outpatient Medications Prior to Visit  Medication Sig Dispense Refill   Etonogestrel (IMPLANON [place]) Inject into the skin.     cyclobenzaprine (FLEXERIL) 10 MG tablet Take 1 tablet (10 mg total) by mouth 2 (two) times daily as needed for muscle spasms. (Patient not taking: Reported on 07/30/2023) 20 tablet 0   ibuprofen (ADVIL) 800 MG tablet Take 800 mg by mouth every 6 (six) hours as needed. (Patient not taking: Reported on 07/30/2023)     metFORMIN (GLUCOPHAGE) 500 MG tablet 1/2 po with lunch daily (Patient not taking: Reported on 07/30/2023) 30 tablet 0   Vitamin D, Ergocalciferol, (DRISDOL) 1.25 MG (50000 UNIT) CAPS capsule 1 PO Q SUN AND WED (Patient not taking: Reported on 07/30/2023) 8 capsule 0   amoxicillin-clavulanate (AUGMENTIN) 875-125 MG tablet Take 1 tablet by mouth every 12 (twelve) hours. (Patient not taking: Reported on 07/30/2023) 14 tablet 0   chlorhexidine (PERIDEX) 0.12 % solution SMARTSIG:By Mouth (Patient not taking: Reported on 07/30/2023)     penicillin v potassium (VEETID) 500 MG tablet Take 500 mg by mouth 4 (four) times daily. (Patient not taking: Reported on 07/30/2023)     No facility-administered medications prior to visit.    No Known Allergies  ROS Review of Systems  Constitutional:  Negative for appetite change, chills, fatigue and fever.  HENT:  Negative for congestion, postnasal drip, rhinorrhea and sneezing.   Respiratory:  Negative for cough, shortness of breath and wheezing.   Cardiovascular:  Negative for chest pain, palpitations and leg swelling.  Gastrointestinal:  Negative for abdominal pain, constipation, nausea and  vomiting.  Genitourinary:  Negative for difficulty urinating, dysuria, flank pain and frequency.  Musculoskeletal:  Negative for arthralgias, back pain, joint swelling and myalgias.  Skin:  Negative for color change, pallor, rash and wound.  Neurological:  Negative for dizziness, facial asymmetry, weakness, numbness and headaches.  Psychiatric/Behavioral:  Positive for sleep disturbance. Negative for behavioral problems, confusion, self-injury and suicidal ideas.       Objective:    Physical Exam Vitals and nursing note reviewed.  Constitutional:      General: She is not in acute distress.    Appearance: Normal appearance. She is obese. She is not ill-appearing, toxic-appearing or diaphoretic.  HENT:  Mouth/Throat:     Mouth: Mucous membranes are moist.     Pharynx: Oropharynx is clear. No oropharyngeal exudate or posterior oropharyngeal erythema.  Eyes:     General: No scleral icterus.       Right eye: No discharge.        Left eye: No discharge.     Extraocular Movements: Extraocular movements intact.     Conjunctiva/sclera: Conjunctivae normal.  Cardiovascular:     Rate and Rhythm: Normal rate and regular rhythm.     Pulses: Normal pulses.     Heart sounds: Normal heart sounds. No murmur heard.    No friction rub. No gallop.  Pulmonary:     Effort: Pulmonary effort is normal. No respiratory distress.     Breath sounds: Normal breath sounds. No stridor. No wheezing, rhonchi or rales.  Chest:     Chest wall: No tenderness.  Abdominal:     General: There is no distension.     Palpations: Abdomen is soft.     Tenderness: There is no abdominal tenderness. There is no right CVA tenderness, left CVA tenderness or guarding.  Musculoskeletal:        General: No swelling, tenderness, deformity or signs of injury.     Right lower leg: No edema.     Left lower leg: No edema.  Skin:    General: Skin is warm and dry.     Capillary Refill: Capillary refill takes less than 2  seconds.     Coloration: Skin is not jaundiced or pale.     Findings: No bruising, erythema or lesion.  Neurological:     Mental Status: She is alert and oriented to person, place, and time.     Motor: No weakness.     Coordination: Coordination normal.     Gait: Gait normal.  Psychiatric:        Mood and Affect: Mood normal.        Behavior: Behavior normal.        Thought Content: Thought content normal.        Judgment: Judgment normal.     BP 118/88   Pulse 85   Temp 97.7 F (36.5 C) (Oral)   Wt 217 lb 9.6 oz (98.7 kg)   SpO2 100%   BMI 42.50 kg/m  Wt Readings from Last 3 Encounters:  07/30/23 217 lb 9.6 oz (98.7 kg)  03/12/23 203 lb (92.1 kg)  01/29/23 200 lb (90.7 kg)    Lab Results  Component Value Date   TSH 1.580 01/14/2023   Lab Results  Component Value Date   WBC 7.7 01/14/2023   HGB 12.9 01/14/2023   HCT 40.4 01/14/2023   MCV 87 01/14/2023   PLT 355 01/14/2023   Lab Results  Component Value Date   NA 141 01/14/2023   K 4.1 01/14/2023   CO2 22 01/14/2023   GLUCOSE 79 01/14/2023   BUN 9 01/14/2023   CREATININE 0.84 01/14/2023   BILITOT 0.2 01/14/2023   ALKPHOS 78 01/14/2023   AST 18 01/14/2023   ALT 29 01/14/2023   PROT 6.9 01/14/2023   ALBUMIN 4.5 01/14/2023   CALCIUM 9.4 01/14/2023   EGFR 93 01/14/2023   Lab Results  Component Value Date   CHOL 205 (H) 01/14/2023   Lab Results  Component Value Date   HDL 66 01/14/2023   Lab Results  Component Value Date   LDLCALC 122 (H) 01/14/2023   Lab Results  Component Value Date   TRIG  93 01/14/2023   Lab Results  Component Value Date   CHOLHDL 2.7 08/14/2022   Lab Results  Component Value Date   HGBA1C 5.8 (H) 01/14/2023      Assessment & Plan:   Problem List Items Addressed This Visit       Respiratory   OSA (obstructive sleep apnea) - Primary   Order placed for a CPAP      Relevant Orders   For home use only DME continuous positive airway pressure (CPAP)     Other    Severe obesity (BMI >= 40) (HCC)   Wt Readings from Last 3 Encounters:  07/30/23 217 lb 9.6 oz (98.7 kg)  03/12/23 203 lb (92.1 kg)  01/29/23 200 lb (90.7 kg)   Body mass index is 42.5 kg/m.  Her weight has increased by about 15 pounds since last visit Interested in starting [facility] Patient denies personal or family history of MTC or MEN 2.  They denied personal history of pancreatitis.   Start Wegovy 0.25 mg once weekly Patient encouraged to avoid fatty fried foods eat smaller portions of meal to help decrease nausea.  Encouraged to report abdominal pain, nausea, vomiting.  We could try phentermine if Reginal Lutes is not approved Patient counseled on low-carb diet Encouraged engage in regular moderate to vigorous exercises at least 150 minutes weekly as tolerated She is not interested in weight loss surgery at this time Benefits of healthy weights discussed Follow-up in 4 weeks      Relevant Medications   Semaglutide-Weight Management (WEGOVY) 0.25 MG/0.5ML SOAJ   Other Relevant Orders   For home use only DME continuous positive airway pressure (CPAP)    Meds ordered this encounter  Medications   Semaglutide-Weight Management (WEGOVY) 0.25 MG/0.5ML SOAJ    Sig: Inject 0.25 mg into the skin once a week.    Dispense:  2 mL    Refill:  0    Follow-up: Return in about 4 weeks (around 08/27/2023), or obesity.    Donell Beers, FNP

## 2023-07-30 NOTE — Assessment & Plan Note (Addendum)
 Order placed for a CPAP

## 2023-07-30 NOTE — Assessment & Plan Note (Addendum)
 Wt Readings from Last 3 Encounters:  07/30/23 217 lb 9.6 oz (98.7 kg)  03/12/23 203 lb (92.1 kg)  01/29/23 200 lb (90.7 kg)   Body mass index is 42.5 kg/m.  Her weight has increased by about 15 pounds since last visit Interested in starting [facility] Patient denies personal or family history of MTC or MEN 2.  They denied personal history of pancreatitis.   Start Wegovy 0.25 mg once weekly Patient encouraged to avoid fatty fried foods eat smaller portions of meal to help decrease nausea.  Encouraged to report abdominal pain, nausea, vomiting.  We could try phentermine if Reginal Lutes is not approved Patient counseled on low-carb diet Encouraged engage in regular moderate to vigorous exercises at least 150 minutes weekly as tolerated She is not interested in weight loss surgery at this time Benefits of healthy weights discussed Follow-up in 4 weeks

## 2023-07-31 ENCOUNTER — Telehealth: Payer: Self-pay

## 2023-07-31 ENCOUNTER — Other Ambulatory Visit: Payer: Self-pay

## 2023-07-31 NOTE — Telephone Encounter (Signed)
 Pharmacy Patient Advocate Encounter   Received notification from CoverMyMeds that prior authorization for [facility] is required/requested.   Insurance verification completed.   The patient is insured through [employer] .   Per test claim: PA required; PA submitted to above mentioned insurance via CoverMyMeds Key/confirmation #/EOC [id number] Status is pending

## 2023-08-01 ENCOUNTER — Other Ambulatory Visit: Payer: Self-pay

## 2023-08-26 ENCOUNTER — Ambulatory Visit: Payer: Self-pay | Admitting: Nurse Practitioner

## 2023-08-28 ENCOUNTER — Other Ambulatory Visit: Payer: Self-pay | Admitting: Nurse Practitioner

## 2023-09-08 ENCOUNTER — Encounter: Payer: Self-pay | Admitting: Nurse Practitioner

## 2023-09-08 ENCOUNTER — Ambulatory Visit (INDEPENDENT_AMBULATORY_CARE_PROVIDER_SITE_OTHER): Payer: Self-pay | Admitting: Nurse Practitioner

## 2023-09-08 DIAGNOSIS — Z720 Tobacco use: Secondary | ICD-10-CM | POA: Diagnosis not present

## 2023-09-08 DIAGNOSIS — E559 Vitamin D deficiency, unspecified: Secondary | ICD-10-CM

## 2023-09-08 DIAGNOSIS — R7303 Prediabetes: Secondary | ICD-10-CM | POA: Diagnosis not present

## 2023-09-08 DIAGNOSIS — G4733 Obstructive sleep apnea (adult) (pediatric): Secondary | ICD-10-CM

## 2023-09-08 MED ORDER — SEMAGLUTIDE (1 MG/DOSE) 4 MG/3ML ~~LOC~~ SOPN: 1.0000 mg | PEN_INJECTOR | SUBCUTANEOUS | 0 refills | Status: DC

## 2023-09-08 MED ORDER — SEMAGLUTIDE(0.25 OR 0.5MG/DOS) 2 MG/3ML ~~LOC~~ SOPN: 0.5000 mg | PEN_INJECTOR | SUBCUTANEOUS | 0 refills | Status: AC

## 2023-09-08 NOTE — Progress Notes (Signed)
 Established Patient Office Visit  Subjective:  Patient ID: Stacey Kerr, female    DOB: [birth year]  Age: 33 y.o. MRN: [id number]  CC:  Chief Complaint  Patient presents with   Weight Loss    HPI Stacey Kerr is a 33 y.o. female   has a past medical history of Abnormal uterine bleeding, ASCUS with positive high risk HPV cervical, Cervical dysplasia, Insomnia (01/28/2023), Joint pain, Obesity (08/14/2022), Obesity (BMI 35.0-39.9 without comorbidity), Occasional tobacco smoker (08/14/2022), Prediabetes (10/28/2022), and Vitamin D  deficiency.  Patient presents for follow-up for obesity  Obesity.  Currently on Wegovy  0.25 mg once weekly, she has been working out 2 days a week and [facility], she has been eating smaller portions.  She denies any adverse reactions to Wegovy       Past Medical History:  Diagnosis Date   Abnormal uterine bleeding    ASCUS with positive high risk HPV cervical    Cervical dysplasia    Insomnia 01/28/2023   Joint pain    Obesity 08/14/2022   Obesity (BMI 35.0-39.9 without comorbidity)    Occasional tobacco smoker 08/14/2022   Prediabetes 10/28/2022   Vitamin D  deficiency     Past Surgical History:  Procedure Laterality Date   CESAREAN SECTION     FEMUR FRACTURE SURGERY Right 2012   LEG SURGERY     PELVIC FRACTURE SURGERY      Family History  Problem Relation Age of Onset   Hyperlipidemia Mother    Hypertension Mother    Sleep apnea Mother    Sleep apnea Father    Hypertension Father    Anxiety disorder Father    Colon cancer Maternal Grandfather    Breast cancer Neg Hx    Cervical cancer Neg Hx     Social History   Socioeconomic History   Marital status: Single    Spouse name: Not on file   Number of children: 1   Years of education: Not on file   Highest education level: Bachelor's degree (e.g., BA, AB, BS)  Occupational History   Occupation: Psychologist, sport and exercise at [employer]  Tobacco Use   Smoking status: Some Days    Types: Cigarettes   Smokeless tobacco: Not on file   Tobacco comments:    Socially smokes a cigarettes every 4 months.   Vaping Use   Vaping status: Never Used  Substance and Sexual Activity   Alcohol use: Yes    Comment: socially    Drug use: No   Sexual activity: Yes    Birth control/protection: Implant  Other Topics Concern   Not on file  Social History Narrative   Lives with parents    Social Drivers of Health   Financial Resource Strain: Low Risk  (10/28/2022)   Overall Financial Resource Strain (CARDIA)    Difficulty of Paying Living Expenses: Not very hard  Food Insecurity: No Food Insecurity (10/28/2022)   Hunger Vital Sign    Worried About Running Out of Food in the Last Year: Never true    Ran Out of Food in the Last Year: Never true  Transportation Needs: No Transportation Needs (10/28/2022)   PRAPARE - Administrator, Civil Service (Medical): No    Lack of Transportation (Non-Medical): No  Physical Activity: Insufficiently Active (10/28/2022)   Exercise Vital Sign    Days of Exercise per Week: 3 days    Minutes of Exercise per Session: 30 min  Stress: Stress Concern Present (10/28/2022)   [name]    Feeling of Stress : To some extent  Social Connections: Moderately Integrated (10/28/2022)   Social Connection and Isolation Panel [NHANES]    Frequency of Communication with Friends and Family: More than three times a week    Frequency of Social Gatherings with Friends and Family: Once a week    Attends Religious Services: 1 to 4 times per year    Active Member of [employer] or Organizations: Yes    Attends Banker Meetings: 1 to 4 times per year    Marital Status: Never married  Intimate Partner Violence: Unknown (07/30/2021)   Received from [employer], [facility]   HITS    Physically Hurt: Not on file    Insult or Talk Down  To: Not on file    Threaten Physical Harm: Not on file    Scream or Curse: Not on file    Outpatient Medications Prior to Visit  Medication Sig Dispense Refill   Etonogestrel (IMPLANON [name]) Inject into the skin.     Semaglutide -Weight Management (WEGOVY ) 0.25 MG/0.5ML SOAJ INJECT 0.25MG  INTO THE SKIN ONE TIME PER WEEK 3 mL 2   cyclobenzaprine  (FLEXERIL ) 10 MG tablet Take 1 tablet (10 mg total) by mouth 2 (two) times daily as needed for muscle spasms. (Patient not taking: Reported on 09/08/2023) 20 tablet 0   ibuprofen  (ADVIL ) 800 MG tablet Take 800 mg by mouth every 6 (six) hours as needed. (Patient not taking: Reported on 09/08/2023)     metFORMIN  (GLUCOPHAGE ) 500 MG tablet 1/2 po with lunch daily (Patient not taking: Reported on 05/07/2023) 30 tablet 0   Vitamin D , Ergocalciferol , (DRISDOL ) 1.25 MG (50000 UNIT) CAPS capsule 1 PO Q SUN AND WED (Patient not taking: Reported on 09/08/2023) 8 capsule 0   No facility-administered medications prior to visit.    No Known Allergies  ROS Review of Systems  Constitutional:  Negative for appetite change, chills, fatigue and fever.  HENT:  Negative for congestion, postnasal drip, rhinorrhea and sneezing.   Respiratory:  Negative for cough, shortness of breath and wheezing.   Cardiovascular:  Negative for chest pain, palpitations and leg swelling.  Gastrointestinal:  Negative for abdominal pain, constipation, nausea and vomiting.  Genitourinary:  Negative for difficulty urinating, dysuria, flank pain and frequency.  Musculoskeletal:  Negative for arthralgias, back pain, joint swelling and myalgias.  Skin:  Negative for color change, pallor, rash and wound.  Neurological:  Negative for dizziness, facial asymmetry, weakness, numbness and headaches.  Psychiatric/Behavioral:  Positive for sleep disturbance. Negative for behavioral problems, confusion, self-injury and suicidal ideas.       Objective:     Physical Exam Vitals and nursing note  reviewed.  Constitutional:      General: She is not in acute distress.    Appearance: Normal appearance. She is not ill-appearing, toxic-appearing or diaphoretic.  HENT:     Mouth/Throat:     Mouth: Mucous membranes are moist.     Pharynx: Oropharynx is clear. No oropharyngeal exudate or posterior oropharyngeal erythema.  Eyes:     General: No scleral icterus.       Right eye: No discharge.        Left eye: No discharge.     Extraocular Movements: Extraocular movements intact.     Conjunctiva/sclera: Conjunctivae normal.  Cardiovascular:     Rate and Rhythm: Normal rate and regular rhythm.  Pulses: Normal pulses.     Heart sounds: Normal heart sounds. No murmur heard.    No friction rub. No gallop.  Pulmonary:     Effort: Pulmonary effort is normal. No respiratory distress.     Breath sounds: Normal breath sounds. No stridor. No wheezing, rhonchi or rales.  Chest:     Chest wall: No tenderness.  Abdominal:     General: There is no distension.     Palpations: Abdomen is soft.     Tenderness: There is no abdominal tenderness. There is no right CVA tenderness, left CVA tenderness or guarding.  Musculoskeletal:        General: No swelling, tenderness, deformity or signs of injury.     Right lower leg: No edema.     Left lower leg: No edema.  Skin:    General: Skin is warm and dry.     Capillary Refill: Capillary refill takes less than 2 seconds.     Coloration: Skin is not jaundiced or pale.     Findings: No bruising, erythema or lesion.  Neurological:     Mental Status: She is alert and oriented to person, place, and time.     Motor: No weakness.     Coordination: Coordination normal.     Gait: Gait normal.  Psychiatric:        Mood and Affect: Mood normal.        Behavior: Behavior normal.        Thought Content: Thought content normal.        Judgment: Judgment normal.     BP 124/71   Pulse 70   Temp (!) 97.4 F (36.3 C)   Wt 211 lb (95.7 kg)   SpO2 100%    BMI 41.21 kg/m  Wt Readings from Last 3 Encounters:  09/08/23 211 lb (95.7 kg)  07/30/23 217 lb 9.6 oz (98.7 kg)  03/12/23 203 lb (92.1 kg)    Lab Results  Component Value Date   TSH 1.580 01/14/2023   Lab Results  Component Value Date   WBC 7.7 01/14/2023   HGB 12.9 01/14/2023   HCT 40.4 01/14/2023   MCV 87 01/14/2023   PLT 355 01/14/2023   Lab Results  Component Value Date   NA 141 01/14/2023   K 4.1 01/14/2023   CO2 22 01/14/2023   GLUCOSE 79 01/14/2023   BUN 9 01/14/2023   CREATININE 0.84 01/14/2023   BILITOT 0.2 01/14/2023   ALKPHOS 78 01/14/2023   AST 18 01/14/2023   ALT 29 01/14/2023   PROT 6.9 01/14/2023   ALBUMIN 4.5 01/14/2023   CALCIUM 9.4 01/14/2023   EGFR 93 01/14/2023   Lab Results  Component Value Date   CHOL 205 (H) 01/14/2023   Lab Results  Component Value Date   HDL 66 01/14/2023   Lab Results  Component Value Date   LDLCALC 122 (H) 01/14/2023   Lab Results  Component Value Date   TRIG 93 01/14/2023   Lab Results  Component Value Date   CHOLHDL 2.7 08/14/2022   Lab Results  Component Value Date   HGBA1C 5.8 (H) 01/14/2023      Assessment & Plan:   Problem List Items Addressed This Visit       Respiratory   OSA (obstructive sleep apnea)   CPAP machine was ordered but she has not received the equipment We will follow-up on this        Other   Occasional tobacco smoker   Occasional  tobacco smoker Smoking cessation encouraged       Vitamin D  deficiency   Patient encouraged to take vitamin D  1000 units once weekly Will recheck labs at next visit Last vitamin D  Lab Results  Component Value Date   VD25OH 17.7 (L) 01/14/2023         Prediabetes   Lab Results  Component Value Date   HGBA1C 5.8 (H) 01/14/2023  Patient counseled on low-carb diet We will recheck labs at next visit      Severe obesity (BMI >= 40) (HCC) - Primary   Wt Readings from Last 3 Encounters:  09/08/23 211 lb (95.7 kg)  07/30/23 217 lb  9.6 oz (98.7 kg)  03/12/23 203 lb (92.1 kg)   Body mass index is 41.21 kg/m.  Patient has lost 6 pounds since her last visit, she was congratulated on her efforts at losing weight Increase Wegovy  to 0.5 mg once weekly after 4 weeks increase to 1 mg once weekly Patient counseled on low-carb diet, encouraged to engage in regular moderate to vigorous exercises at least 150 minutes weekly as tolerated Follow-up in 2 months      Relevant Medications   Semaglutide ,0.25 or 0.5MG /DOS, 2 MG/3ML SOPN   Semaglutide , 1 MG/DOSE, 4 MG/3ML SOPN (Start on 10/06/2023)    Meds ordered this encounter  Medications   Semaglutide ,0.25 or 0.5MG /DOS, 2 MG/3ML SOPN    Sig: Inject 0.5 mg into the skin once a week.    Dispense:  3 mL    Refill:  0   Semaglutide , 1 MG/DOSE, 4 MG/3ML SOPN    Sig: Inject 1 mg as directed once a week.    Dispense:  3 mL    Refill:  0    Follow-up: Return in about 2 months (around 11/08/2023) for CPE.    Tayven Renteria R Marylynn Rigdon, FNP

## 2023-09-08 NOTE — Patient Instructions (Signed)
 1. Prediabetes (Primary)   2. Occasional tobacco smoker   3. Severe obesity (BMI >= 40) (HCC)  - Semaglutide ,0.25 or 0.5MG /DOS, 2 MG/3ML SOPN; Inject 0.5 mg into the skin once a week.  Dispense: 3 mL; Refill: 0 - Semaglutide , 1 MG/DOSE, 4 MG/3ML SOPN; Inject 1 mg as directed once a week.  Dispense: 3 mL; Refill: 0     It is important that you exercise regularly at least 30 minutes 5 times a week as tolerated  Think about what you will eat, plan ahead. Choose " clean, green, fresh or frozen" over canned, processed or packaged foods which are more sugary, salty and fatty. 70 to 75% of food eaten should be vegetables and fruit. Three meals at set times with snacks allowed between meals, but they must be fruit or vegetables. Aim to eat over a 12 hour period , example 7 am to 7 pm, and STOP after  your last meal of the day. Drink water,generally about 64 ounces per day, no other drink is as healthy. Fruit juice is best enjoyed in a healthy way, by EATING the fruit.  Thanks for choosing [facility] we consider it a privelige to serve you.

## 2023-09-08 NOTE — Assessment & Plan Note (Addendum)
 Wt Readings from Last 3 Encounters:  09/08/23 211 lb (95.7 kg)  07/30/23 217 lb 9.6 oz (98.7 kg)  03/12/23 203 lb (92.1 kg)   Body mass index is 41.21 kg/m.  Patient has lost 6 pounds since her last visit, she was congratulated on her efforts at losing weight Increase Wegovy  to 0.5 mg once weekly after 4 weeks increase to 1 mg once weekly Patient counseled on low-carb diet, encouraged to engage in regular moderate to vigorous exercises at least 150 minutes weekly as tolerated Follow-up in 2 months

## 2023-09-08 NOTE — Assessment & Plan Note (Signed)
 CPAP machine was ordered but she has not received the equipment We will follow-up on this

## 2023-09-08 NOTE — Assessment & Plan Note (Signed)
 Patient encouraged to take vitamin D  1000 units once weekly Will recheck labs at next visit Last vitamin D  Lab Results  Component Value Date   VD25OH 17.7 (L) 01/14/2023

## 2023-09-08 NOTE — Assessment & Plan Note (Signed)
 Lab Results  Component Value Date   HGBA1C 5.8 (H) 01/14/2023  Patient counseled on low-carb diet We will recheck labs at next visit

## 2023-09-08 NOTE — Assessment & Plan Note (Addendum)
 Occasional tobacco smoker Smoking cessation encouraged

## 2023-09-11 DIAGNOSIS — F4320 Adjustment disorder, unspecified: Secondary | ICD-10-CM | POA: Diagnosis not present

## 2023-09-30 DIAGNOSIS — F4320 Adjustment disorder, unspecified: Secondary | ICD-10-CM | POA: Diagnosis not present

## 2023-10-02 DIAGNOSIS — G4733 Obstructive sleep apnea (adult) (pediatric): Secondary | ICD-10-CM | POA: Diagnosis not present

## 2023-10-15 DIAGNOSIS — F4322 Adjustment disorder with anxiety: Secondary | ICD-10-CM | POA: Diagnosis not present

## 2023-10-30 DIAGNOSIS — F4322 Adjustment disorder with anxiety: Secondary | ICD-10-CM | POA: Diagnosis not present

## 2023-11-01 DIAGNOSIS — G4733 Obstructive sleep apnea (adult) (pediatric): Secondary | ICD-10-CM | POA: Diagnosis not present

## 2023-11-03 DIAGNOSIS — G4733 Obstructive sleep apnea (adult) (pediatric): Secondary | ICD-10-CM | POA: Diagnosis not present

## 2023-11-11 ENCOUNTER — Ambulatory Visit: Payer: Self-pay | Admitting: Nurse Practitioner

## 2023-11-11 DIAGNOSIS — Z0100 Encounter for examination of eyes and vision without abnormal findings: Secondary | ICD-10-CM | POA: Diagnosis not present

## 2023-11-18 DIAGNOSIS — F4322 Adjustment disorder with anxiety: Secondary | ICD-10-CM | POA: Diagnosis not present

## 2023-11-25 ENCOUNTER — Other Ambulatory Visit: Payer: Self-pay

## 2023-11-25 MED ORDER — SEMAGLUTIDE (1 MG/DOSE) 4 MG/3ML ~~LOC~~ SOPN: 1.0000 mg | PEN_INJECTOR | SUBCUTANEOUS | 0 refills | Status: DC

## 2023-11-26 ENCOUNTER — Other Ambulatory Visit: Payer: Self-pay | Admitting: Nurse Practitioner

## 2023-11-26 MED ORDER — SEMAGLUTIDE-WEIGHT MANAGEMENT 1 MG/0.5ML ~~LOC~~ SOAJ: 1.0000 mg | SUBCUTANEOUS | 0 refills | Status: AC

## 2023-12-02 DIAGNOSIS — G4733 Obstructive sleep apnea (adult) (pediatric): Secondary | ICD-10-CM | POA: Diagnosis not present

## 2023-12-03 DIAGNOSIS — G4733 Obstructive sleep apnea (adult) (pediatric): Secondary | ICD-10-CM | POA: Diagnosis not present

## 2023-12-05 ENCOUNTER — Other Ambulatory Visit: Payer: Self-pay

## 2023-12-05 DIAGNOSIS — F4322 Adjustment disorder with anxiety: Secondary | ICD-10-CM | POA: Diagnosis not present

## 2023-12-19 ENCOUNTER — Encounter: Payer: Self-pay | Admitting: Nurse Practitioner

## 2024-03-11 DIAGNOSIS — F4322 Adjustment disorder with anxiety: Secondary | ICD-10-CM | POA: Diagnosis not present

## 2024-04-08 DIAGNOSIS — F4322 Adjustment disorder with anxiety: Secondary | ICD-10-CM | POA: Diagnosis not present

## 2024-05-25 ENCOUNTER — Ambulatory Visit: Admitting: Nurse Practitioner

## 2024-06-16 ENCOUNTER — Ambulatory Visit: Admitting: Nurse Practitioner
